# Patient Record
Sex: Female | Born: 2000 | Race: White | Hispanic: No | Marital: Single | Smoking: Never smoker
Health system: Southern US, Community
[De-identification: ages and names within clinical notes are randomized; demographics above are authoritative.]

## PROBLEM LIST (undated history)

## (undated) DIAGNOSIS — Q446 Cystic disease of liver: Secondary | ICD-10-CM

## (undated) DIAGNOSIS — K219 Gastro-esophageal reflux disease without esophagitis: Secondary | ICD-10-CM

## (undated) DIAGNOSIS — I10 Essential (primary) hypertension: Secondary | ICD-10-CM

## (undated) DIAGNOSIS — N83209 Unspecified ovarian cyst, unspecified side: Secondary | ICD-10-CM

## (undated) DIAGNOSIS — Z87442 Personal history of urinary calculi: Secondary | ICD-10-CM

## (undated) DIAGNOSIS — G930 Cerebral cysts: Secondary | ICD-10-CM

## (undated) DIAGNOSIS — Z8719 Personal history of other diseases of the digestive system: Secondary | ICD-10-CM

## (undated) DIAGNOSIS — Q613 Polycystic kidney, unspecified: Secondary | ICD-10-CM

## (undated) DIAGNOSIS — R519 Headache, unspecified: Secondary | ICD-10-CM

## (undated) DIAGNOSIS — N200 Calculus of kidney: Secondary | ICD-10-CM

## (undated) HISTORY — DX: Cerebral cysts: G93.0

## (undated) HISTORY — PX: TONSILLECTOMY: SUR1361

## (undated) HISTORY — PX: WISDOM TOOTH EXTRACTION: SHX21

## (undated) HISTORY — DX: Calculus of kidney: N20.0

## (undated) HISTORY — DX: Unspecified ovarian cyst, unspecified side: N83.209

## (undated) HISTORY — PX: ADENOIDECTOMY: SUR15

## (undated) HISTORY — DX: Essential (primary) hypertension: I10

## (undated) HISTORY — DX: Cystic disease of liver: Q44.6

## (undated) HISTORY — DX: Polycystic kidney, unspecified: Q61.3

## (undated) HISTORY — PX: TONSILLECTOMY AND ADENOIDECTOMY: SUR1326

---
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
## 2021-01-04 ENCOUNTER — Ambulatory Visit
Admission: RE | Admit: 2021-01-04 | Discharge: 2021-01-04 | Disposition: A | Discharge status: Home / Self Care | Payer: BC Managed Care – PPO | Source: Ambulatory Visit | Attending: Physician Assistant | Admitting: Physician Assistant

## 2021-01-04 DIAGNOSIS — K7689 Other specified diseases of liver: Secondary | ICD-10-CM | POA: Diagnosis not present

## 2021-01-04 DIAGNOSIS — N281 Cyst of kidney, acquired: Secondary | ICD-10-CM | POA: Diagnosis not present

## 2021-01-04 DIAGNOSIS — R1013 Epigastric pain: Secondary | ICD-10-CM

## 2021-01-06 ENCOUNTER — Other Ambulatory Visit: Payer: Self-pay

## 2021-01-06 DIAGNOSIS — Q613 Polycystic kidney, unspecified: Secondary | ICD-10-CM

## 2021-01-12 ENCOUNTER — Encounter: Payer: Self-pay | Admitting: Nurse Practitioner

## 2021-01-12 ENCOUNTER — Encounter: Payer: Self-pay | Admitting: Physician Assistant

## 2021-01-18 ENCOUNTER — Other Ambulatory Visit: Payer: Self-pay

## 2021-01-18 ENCOUNTER — Ambulatory Visit (INDEPENDENT_AMBULATORY_CARE_PROVIDER_SITE_OTHER): Payer: BC Managed Care – PPO | Admitting: Internal Medicine

## 2021-01-18 ENCOUNTER — Encounter: Payer: Self-pay | Admitting: Internal Medicine

## 2021-01-18 VITALS — BP 124/78 | HR 77 | Temp 98.0°F | Resp 12 | Ht 65.0 in | Wt 126.0 lb

## 2021-01-18 DIAGNOSIS — Q613 Polycystic kidney, unspecified: Secondary | ICD-10-CM

## 2021-01-18 DIAGNOSIS — R519 Headache, unspecified: Secondary | ICD-10-CM

## 2021-01-18 NOTE — Progress Notes (Signed)
New Patient Office Visit  Subjective:  Patient ID: Alison Ballard, female    DOB: [birth year]  Age: 20 y.o. MRN: [id number]  CC:  No chief complaint on file.   HPI Patient is an 20 year old Caucasian female with no significant past medical history.  Patient presented to the primary care provider recently with intermittent back/flank pain, recurrent abdominal pain, including epigastric pain; fullness of the flank with associated early satiety.  Patient was seen by her primary care provider, and right upper quadrant ultrasound was ordered to rule out gallbladder pathology.  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  Patient's mother has had ultrasound of the kidney that was negative for cysts.  Patient's father's work-up is still pending.  Patient denied noticing bloody or foamy urine.  Blood pressure has been controlled.  No history of previously diagnosed recurrent UTIs/pyelonephritis or nephrolithiasis.  Patient has history of persistent headaches.  No neck pain, chest pain, shortness of breath, URI symptoms, fever or chills.  Patient endorses history of nausea with epigastric discomfort and burning with urination.  UA done at the office was negative for nitrites or leukocytosis, but revealed 15 Mg per DL urinary protein and specific gravity of 1.030.    History reviewed. No pertinent past medical history.  Past Surgical History:  Procedure Laterality Date   ADENOIDECTOMY     TONSILLECTOMY      Family History  Problem Relation Age of Onset   Hypertension Mother    Healthy Mother    Hyperlipidemia Father    Healthy Father     Social History   Socioeconomic History   Marital status: Single    Spouse name: Not on file   Number of children: Not on file   Years of education: Not on file   Highest education level: Not on file  Occupational History   Not on  file  Tobacco Use   Smoking status: Never   Smokeless tobacco: Never  Vaping Use   Vaping Use: Never used  Substance and Sexual Activity   Alcohol use: Never   Drug use: Never   Sexual activity: Not Currently  Other Topics Concern   Not on file  Social History Narrative   Not on file   Social Determinants of Health   Financial Resource Strain: Not on file  Food Insecurity: Not on file  Transportation Needs: Not on file  Physical Activity: Not on file  Stress: Not on file  Social Connections: Not on file  Intimate Partner Violence: Not on file    ROS Review of Systems  Constitutional: Negative.   HENT: Negative.    Eyes: Negative.   Respiratory: Negative.    Cardiovascular: Negative.   Gastrointestinal:  Positive for nausea.  Endocrine: Negative.   Genitourinary:  Positive for dysuria and flank pain.  Allergic/Immunologic: Negative.   Neurological: Negative.   Hematological: Negative.   Psychiatric/Behavioral: Negative.     Objective:   Today's Vitals: BP 124/78   Pulse 77   Temp 98 F (36.7 C)   Resp 12   Ht 5\' 5"  (1.651 m)   Wt 126 lb (57.2 kg)   SpO2 98%   BMI 20.97 kg/m   PHYSICAL EXAM Physical Exam Constitutional:      Appearance: Normal appearance. She is normal weight.  HENT:     Head: Normocephalic and atraumatic.     Nose: Nose normal.  Mouth/Throat:     Mouth: Mucous membranes are moist.     Pharynx: Oropharynx is clear.  Eyes:     Extraocular Movements: Extraocular movements intact.     Pupils: Pupils are equal, round, and reactive to light.     Comments: Mild pallor.  Cardiovascular:     Rate and Rhythm: Normal rate and regular rhythm.     Pulses: Normal pulses.     Heart sounds: Normal heart sounds.  Pulmonary:     Effort: Pulmonary effort is normal.     Breath sounds: Normal breath sounds.  Abdominal:     General: Abdomen is flat. Bowel sounds are normal.     Palpations: Abdomen is soft.  Musculoskeletal:     Cervical  back: Normal range of motion and neck supple.     Right lower leg: No edema.     Left lower leg: No edema.  Neurological:     General: No focal deficit present.     Mental Status: She is alert and oriented to person, place, and time. Mental status is at baseline.  Psychiatric:        Mood and Affect: Mood normal.        Behavior: Behavior normal.        Thought Content: Thought content normal.        Judgment: Judgment normal.    Assessment & Plan:   Problem List Items Addressed This Visit       Genitourinary   Polycystic kidney disease   Relevant Orders   CT ABDOMEN PELVIS W WO CONTRAST   MR Brain W Wo Contrast   Other Visit Diagnoses     Nonintractable headache, unspecified chronicity pattern, unspecified headache type    -  Primary   Relevant Orders   CT ABDOMEN PELVIS W WO CONTRAST   MR Brain W Wo Contrast       Outpatient Encounter Medications as of 01/18/2021  Medication Sig   ondansetron (ZOFRAN) 4 MG tablet Take 1 tablet (4 mg total) by mouth every 8 (eight) hours as needed for nausea or vomiting.   No facility-administered encounter medications on file as of 01/18/2021.    Follow-up: Return in about 1 year (around 01/18/2022), with the goal being for the patient to follow-up with local nephrology group in [place], [place].Barnetta Chapel, MD

## 2021-01-24 ENCOUNTER — Encounter: Payer: Self-pay | Admitting: Physician Assistant

## 2021-01-24 DIAGNOSIS — Q613 Polycystic kidney, unspecified: Secondary | ICD-10-CM

## 2021-01-25 NOTE — Addendum Note (Signed)
Addended by: Sylvester Harder on: 01/25/2021 09:10 AM   Modules accepted: Orders

## 2021-01-31 ENCOUNTER — Ambulatory Visit (HOSPITAL_COMMUNITY)
Admission: RE | Admit: 2021-01-31 | Discharge: 2021-01-31 | Disposition: A | Discharge status: Home / Self Care | Payer: BC Managed Care – PPO | Source: Ambulatory Visit | Attending: Internal Medicine | Admitting: Internal Medicine

## 2021-01-31 ENCOUNTER — Other Ambulatory Visit: Payer: Self-pay

## 2021-01-31 ENCOUNTER — Encounter (HOSPITAL_COMMUNITY): Payer: Self-pay

## 2021-01-31 DIAGNOSIS — K7689 Other specified diseases of liver: Secondary | ICD-10-CM | POA: Diagnosis not present

## 2021-01-31 DIAGNOSIS — R519 Headache, unspecified: Secondary | ICD-10-CM | POA: Insufficient documentation

## 2021-01-31 DIAGNOSIS — Q613 Polycystic kidney, unspecified: Secondary | ICD-10-CM

## 2021-01-31 DIAGNOSIS — N200 Calculus of kidney: Secondary | ICD-10-CM | POA: Diagnosis not present

## 2021-01-31 MED ORDER — GADOBUTROL 1 MMOL/ML IV SOLN
6.0000 mL | Freq: Once | INTRAVENOUS | Status: AC | PRN
  Administered 2021-01-31: 6 mL via INTRAVENOUS

## 2021-01-31 MED ORDER — IOHEXOL 350 MG/ML SOLN
75.0000 mL | Freq: Once | INTRAVENOUS | Status: AC | PRN
  Administered 2021-01-31: 75 mL via INTRAVENOUS

## 2021-02-01 NOTE — Progress Notes (Signed)
02/01/2021 Alison Ballard [id number] [birth year]   CHIEF COMPLAINT: abdominal pain, bloat and constipation   HISTORY OF PRESENT ILLNESS:  Alison Ballard is an 20 who was recently diagnosed with of polycytic kidney disease. Past surgical history includes a tonsillectomy in 2008. She was referred to our office by Alison Masker PA-C for further evaluation regarding early satiety and epigastric pain.  She is accompanied by her mother.  She complains of having localized burning epigastric pain for the past 2 years.  She took Omeprazole for a few months as prescribed by her PCP approximately 1 year ago without improvement.  Her epigastric pain previously occurred twice weekly and for the past few months occurs once weekly.  No dysphagia.  She has nausea every time she eats for the past year.  She vomits partially digested food or dry heaves once monthly.  No coffee-ground emesis or hematemesis.  She takes a few bites of food and feels full easily.  She has chronic constipation since early childhood as reported by her mother.  She reports passing a moderate amount of clumps of brown stool once weekly.  No rectal bleeding or black stools.  She complains of abdominal gas bloat discomfort which occurs daily.  Her nausea and abdominal bloat symptoms are worse after she eats greasy or fried foods.  She drinks 16 ounces of water daily.  She took MiraLAX daily in the past for 1 year without significant improvement in her constipation pattern.  No history of an eating disorder.  She was seen by her PCP earlier in June due to her GI symptoms and abdominal image studies resulted in the diagnosis of her polycystic kidney disease with multiple liver cysts. 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  She  underwent a CTAP 01/31/2021 which showed multiple hepatic cysts throughout both lobes of the liver, the largest measuring 1.1 cm in the right hepatic lobe and 1.3 cm in segment 4B, no suspicious liver lesions were noted.  Pulm bilateral renal masses, too small to characterize but is most likely represent complex cysts, other low-density renal lesions favored to represent cysts and minimally complex cysts. CT findings consistent with polycystic kidney/liver disease.  Colonic stool burden suggestive of constipation was also noted.  Labs 12/16/2020 show normal renal function and LFTs were also normal.  Alison Ballard's mother underwent and negative testing for polycystic kidney disease and her father is scheduled to undergo similar testing next week.  No known family history of polycystic kidney/liver disease.  CBC Latest Ref Rng & Units 12/16/2020  WBC 3.4 - 10.8 x10E3/uL 5.4  Hemoglobin 11.1 - 15.9 g/dL 00.9  Hematocrit 38.1 - 46.6 % 35.6  Platelets 150 - 450 x10E3/uL 281     CMP Latest Ref Rng & Units 12/16/2020  Glucose 65 - 99 mg/dL 87  BUN 6 - 20 mg/dL 8  Creatinine 8.29 - 9.37 mg/dL 1.69  Sodium 678 - 938 mmol/L 139  Potassium 3.5 - 5.2 mmol/L 4.0  Chloride 96 - 106 mmol/L 101  CO2 20 - 29 mmol/L 24  Calcium 8.7 - 10.2 mg/dL 9.9  Total Protein 6.0 - 8.5 g/dL 7.3  Total Bilirubin 0.0 - 1.2 mg/dL 0.4  Alkaline Phos 42 - 106 IU/L 53  AST 0 - 40 IU/L 13  ALT 0 - 32 IU/L 6  Past Surgical History:  Procedure Laterality Date   ADENOIDECTOMY     TONSILLECTOMY     RUQ sonogram 01/04/2021: Abdominal sonogram:  Gallbladder:No gallstones or wall thickening visualized. No sonographic Murphy sign noted by sonographer.   Common bile duct:Diameter: 3.2 mm, nondilated.   Liver:Multitude of small simple appearing cysts present throughout the liver, largest measuring up to 1 point cm in size without concerning internal complexity. Normal background parenchymal echogenicity. Smooth liver surface contour.  No visible concerning solid liver lesion. No intrahepatic biliary ductal dilatation. Portal vein is patent on color Doppler imaging with normal direction of blood flow towards the liver.   Other: Numerous renal cysts are incidentally noted within the partially visualized kidney as well several of which demonstrate some peripheral echogenicity/possible calcification.   IMPRESSION: Multiple small simple appearing cyst present diffusely throughout the liver with cysts throughout the partially visualized right kidney. Such a constellation of findings can be seen with autosomal dominant polycystic kidney disease.   Otherwise unremarkable right upper quadrant ultrasound.   CTAP w/wo contrast 01/31/2021: Stomach/Bowel: Normal stomach, without wall thickening. Colonic stool burden suggests constipation. The cecum extends into the upper central pelvis. Normal terminal ileum. The appendix is positioned to the left of midline and normal Adrenals/Urinary Tract: Normal adrenal glands. Punctate bilateral renal collecting system calculi. Multiple bilateral renal masses. Some demonstrate precontrast hyperattenuation on series 2 including at maximally 9 mm on image 29. These are all too small to characterize, but most likely represent complex cysts. Other well-circumscribed low-density renal lesions are favored to represent cysts and minimally complex cyst. Example at 1.7 cm within the interpolar left kidney on 27/11. Maximally 1.9 cm within the interpolar right kidney including on 37/11. No hydronephrosis. Normal post-contrast appearance of the bladder. Hepatobiliary: Multiple hepatic cysts throughout both lobes the liver. The largest lesions including 1.1 cm in the high right hepatic lobe on 08/06 and 1.3 cm in segment 4 B on 35/6. No suspicious liver lesion. Normal gallbladder, without biliary ductal dilatation. 1. Findings most consistent with polycystic kidney/liver disease. 2. Bilateral  nephrolithiasis without obstructive uropathy. 3.  Possible constipation. 4. Left ovarian corpus luteal cyst  Social History: She is single. 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  Non-smoker.  No alcohol use.  No drug use.  Family History: Mother with hypertension. Father hyperlipidemia. Brother age 67 healthy.   No Known Allergies    Outpatient Encounter Medications as of 02/02/2021  Medication Sig   ondansetron (ZOFRAN) 4 MG tablet Take 1 tablet (4 mg total) by mouth every 8 (eight) hours as needed for nausea or vomiting.   No facility-administered encounter medications on file as of 02/02/2021.   REVIEW OF SYSTEMS:  Gen: Denies fever, sweats or chills. No weight loss.  CV: Denies chest pain, palpitations or edema. Resp: Denies cough, shortness of breath of hemoptysis.  GI: See HPI. GU : Denies urinary burning, blood in urine, increased urinary frequency or incontinence. MS: Denies joint pain, muscles aches or weakness. Derm: Denies rash, itchiness, skin lesions or unhealing ulcers. Psych: Denies depression, anxiety or memory loss. Heme: Denies bruising, bleeding. Neuro:  Denies headaches, dizziness or paresthesias. Endo:  Denies any problems with DM, thyroid or adrenal function.  PHYSICAL EXAM: BP 110/70 (BP Location: Left Arm, Patient Position: Sitting, Cuff Size: Normal)   Pulse 80   Ht 5\' 5"  (1.651 m) Comment: height measured without shoes  Wt 130 lb (59 kg)   LMP 01/04/2021 (Approximate)  BMI 21.63 kg/m   General: Pleasant 20 year old female in no acute distress. Head: Normocephalic and atraumatic. Eyes:  Sclerae non-icteric, conjunctive pink. Ears: Normal auditory acuity. Mouth: Dentition intact. No ulcers or lesions.  Neck: Supple, no lymphadenopathy or thyromegaly.  Lungs: Clear bilaterally to auscultation without wheezes, crackles or rhonchi. Heart: Regular rate and rhythm. No murmur, rub or gallop appreciated.  Abdomen: Soft,  nontender, non distended. No masses. No hepatosplenomegaly. Normoactive bowel sounds x 4 quadrants.  Rectal: Deferred. Musculoskeletal: Symmetrical with no gross deformities. Skin: Warm and dry. No rash or lesions on visible extremities. Extremities: No edema. Neurological: Alert oriented x 4, no focal deficits.  Psychological:  Alert and cooperative. Normal mood and affect.  ASSESSMENT AND PLAN:  11. 20 year old female with nausea, intermittent vomiting and epigastric pain. RUQ sono showed a normal gallbladder.  -Check stool H. pylori antigen -Pantoprazole 20 mg daily, to start after H. pylori stool antigen specimen collected -GERD diet handout -Patient to call office if symptoms worsen -Eventual EGD if symptoms persists  -Zofran as previously prescribed as needed -Follow-up with Dr. Lavon Paganini in 6 to 8 weeks  2.  Chronic constipation with associated abdominal bloat -Patient elects to restart MiraLAX once daily, will increase to twice daily if needed -If no improvement in 2 weeks I will prescribe Linzess. Goal is to increase stool output which will hopefully reduce her abdominal bloat and possibly reduce her nausea -Probiotic of choice, elective -TSH, TTG, IgA level -Increase water intake    3. Polycystic kidney disease, newly diagnosed.  She was seen at [facility] in [place] and is currently in the process of transitioning to [facility] in [place]  4. Multiple liver cysts in setting of newly diagnosed polycystic kidney disease.  Normal LFTs. - I discussed with the patient and her mother that her liver cysts are benign findings -I will consult with Dr. Lavon Paganini to verify if any further hepatology evaluation required or if surveillance liver imaging required to assess for increase in size of the liver cysts     CC:  Alison Masker, PA-C

## 2021-02-02 ENCOUNTER — Encounter: Payer: Self-pay | Admitting: Nurse Practitioner

## 2021-02-02 ENCOUNTER — Ambulatory Visit (INDEPENDENT_AMBULATORY_CARE_PROVIDER_SITE_OTHER): Payer: BC Managed Care – PPO | Admitting: Nurse Practitioner

## 2021-02-02 ENCOUNTER — Other Ambulatory Visit: Payer: Self-pay | Admitting: *Deleted

## 2021-02-02 ENCOUNTER — Other Ambulatory Visit (INDEPENDENT_AMBULATORY_CARE_PROVIDER_SITE_OTHER): Payer: BC Managed Care – PPO

## 2021-02-02 VITALS — BP 110/70 | HR 80 | Ht 65.0 in | Wt 130.0 lb

## 2021-02-02 DIAGNOSIS — R1013 Epigastric pain: Secondary | ICD-10-CM

## 2021-02-02 DIAGNOSIS — K7689 Other specified diseases of liver: Secondary | ICD-10-CM | POA: Diagnosis not present

## 2021-02-02 DIAGNOSIS — K5900 Constipation, unspecified: Secondary | ICD-10-CM

## 2021-02-02 DIAGNOSIS — R112 Nausea with vomiting, unspecified: Secondary | ICD-10-CM | POA: Diagnosis not present

## 2021-02-02 LAB — TSH: TSH: 0.72 u[IU]/mL (ref 0.40–5.00)

## 2021-02-02 MED ORDER — PANTOPRAZOLE SODIUM 20 MG PO TBEC: 20.0000 mg | DELAYED_RELEASE_TABLET | Freq: Every day | ORAL | 1 refills | Status: DC

## 2021-02-02 NOTE — Patient Instructions (Signed)
Your provider has requested that you go to the basement level for lab work before leaving today. Press "B" on the elevator. The lab is located at the first door on the left as you exit the elevator.   Due to recent changes in healthcare laws, you may see the results of your imaging and laboratory studies on MyChart before your provider has had a chance to review them.  We understand that in some cases there may be results that are confusing or concerning to you. Not all laboratory results come back in the same time frame and the provider may be waiting for multiple results in order to interpret others.  Please give [place] 48 hours in order for your provider to thoroughly review all the results before contacting the office for clarification of your results.   We have sent the following medications to your pharmacy for you to pick up at your convenience: Pantoprazole, please don't start this medicine until you complete your stool testing  We are providing you with GERD information to read and follow.  Take over the counter Miralax daily, may take twice daily if needed.  Call our office if your symptoms get worse.  Follow up with Dr Lavon Paganini in 6-8 weeks please.  I appreciate the opportunity to care for you. Alcide Evener, CRNP

## 2021-02-03 LAB — TISSUE TRANSGLUTAMINASE, IGA: (tTG) Ab, IgA: <1 U/mL

## 2021-02-03 LAB — IGA: Immunoglobulin A: 126 mg/dL (ref 47–310)

## 2021-02-08 ENCOUNTER — Encounter: Payer: Self-pay | Admitting: Physician Assistant

## 2021-02-15 ENCOUNTER — Other Ambulatory Visit: Payer: BC Managed Care – PPO

## 2021-02-15 DIAGNOSIS — R112 Nausea with vomiting, unspecified: Secondary | ICD-10-CM

## 2021-02-15 DIAGNOSIS — R1013 Epigastric pain: Secondary | ICD-10-CM

## 2021-02-15 DIAGNOSIS — K5900 Constipation, unspecified: Secondary | ICD-10-CM

## 2021-02-17 LAB — H. PYLORI ANTIGEN, STOOL: H pylori Ag, Stl: NEGATIVE

## 2021-03-02 DIAGNOSIS — Z6821 Body mass index (BMI) 21.0-21.9, adult: Secondary | ICD-10-CM | POA: Diagnosis not present

## 2021-03-02 DIAGNOSIS — Z01419 Encounter for gynecological examination (general) (routine) without abnormal findings: Secondary | ICD-10-CM | POA: Diagnosis not present

## 2021-03-07 NOTE — Progress Notes (Signed)
Reviewed and agree with documentation and assessment and plan. K. Veena Vayda Dungee , MD   

## 2021-03-15 DIAGNOSIS — N181 Chronic kidney disease, stage 1: Secondary | ICD-10-CM | POA: Diagnosis not present

## 2021-03-15 DIAGNOSIS — N200 Calculus of kidney: Secondary | ICD-10-CM | POA: Diagnosis not present

## 2021-03-15 DIAGNOSIS — Q613 Polycystic kidney, unspecified: Secondary | ICD-10-CM | POA: Diagnosis not present

## 2021-03-15 DIAGNOSIS — R030 Elevated blood-pressure reading, without diagnosis of hypertension: Secondary | ICD-10-CM | POA: Diagnosis not present

## 2021-03-17 ENCOUNTER — Other Ambulatory Visit: Payer: Self-pay | Admitting: Nephrology

## 2021-03-17 DIAGNOSIS — N181 Chronic kidney disease, stage 1: Secondary | ICD-10-CM

## 2021-03-17 DIAGNOSIS — Q613 Polycystic kidney, unspecified: Secondary | ICD-10-CM

## 2021-03-23 ENCOUNTER — Ambulatory Visit
Admission: RE | Admit: 2021-03-23 | Discharge: 2021-03-23 | Disposition: A | Discharge status: Home / Self Care | Payer: BC Managed Care – PPO | Source: Ambulatory Visit | Attending: Nephrology | Admitting: Nephrology

## 2021-03-23 DIAGNOSIS — N281 Cyst of kidney, acquired: Secondary | ICD-10-CM | POA: Diagnosis not present

## 2021-03-23 DIAGNOSIS — N181 Chronic kidney disease, stage 1: Secondary | ICD-10-CM

## 2021-03-23 DIAGNOSIS — Q613 Polycystic kidney, unspecified: Secondary | ICD-10-CM

## 2021-03-25 ENCOUNTER — Encounter: Payer: Self-pay | Admitting: Physician Assistant

## 2021-03-31 ENCOUNTER — Other Ambulatory Visit: Payer: Self-pay | Admitting: Nephrology

## 2021-03-31 DIAGNOSIS — R519 Headache, unspecified: Secondary | ICD-10-CM

## 2021-03-31 DIAGNOSIS — Q613 Polycystic kidney, unspecified: Secondary | ICD-10-CM

## 2021-04-05 ENCOUNTER — Other Ambulatory Visit: Payer: Self-pay

## 2021-04-05 ENCOUNTER — Encounter: Payer: Self-pay | Admitting: Gastroenterology

## 2021-04-05 ENCOUNTER — Ambulatory Visit (INDEPENDENT_AMBULATORY_CARE_PROVIDER_SITE_OTHER): Payer: BC Managed Care – PPO | Admitting: Gastroenterology

## 2021-04-05 ENCOUNTER — Ambulatory Visit
Admission: RE | Admit: 2021-04-05 | Discharge: 2021-04-05 | Disposition: A | Discharge status: Home / Self Care | Payer: BC Managed Care – PPO | Source: Ambulatory Visit | Attending: Nephrology | Admitting: Nephrology

## 2021-04-05 VITALS — BP 118/72 | HR 82 | Ht 65.0 in | Wt 135.6 lb

## 2021-04-05 DIAGNOSIS — R519 Headache, unspecified: Secondary | ICD-10-CM | POA: Diagnosis not present

## 2021-04-05 DIAGNOSIS — K7689 Other specified diseases of liver: Secondary | ICD-10-CM | POA: Diagnosis not present

## 2021-04-05 DIAGNOSIS — Q446 Cystic disease of liver: Secondary | ICD-10-CM

## 2021-04-05 DIAGNOSIS — K5904 Chronic idiopathic constipation: Secondary | ICD-10-CM | POA: Diagnosis not present

## 2021-04-05 DIAGNOSIS — Q613 Polycystic kidney, unspecified: Secondary | ICD-10-CM

## 2021-04-05 DIAGNOSIS — R112 Nausea with vomiting, unspecified: Secondary | ICD-10-CM | POA: Diagnosis not present

## 2021-04-05 DIAGNOSIS — K219 Gastro-esophageal reflux disease without esophagitis: Secondary | ICD-10-CM | POA: Diagnosis not present

## 2021-04-05 MED ORDER — PANTOPRAZOLE SODIUM 20 MG PO TBEC: 20.0000 mg | DELAYED_RELEASE_TABLET | Freq: Every day | ORAL | 1 refills | Status: DC

## 2021-04-05 NOTE — Progress Notes (Signed)
Alison Ballard    [id number]    Jun 26, 2001  Primary Care Physician:Abonza, Genia Plants  Referring Physician: Mayer Masker, PA-C [address] [place],  [place] [address]   Chief complaint: Constipation  HPI:  20 year old very pleasant female here for follow-up visit accompanied by her mother for chronic constipation She has history of polycystic kidney and liver disease. She is having daily bowel movement with MiraLAX  She also using pantoprazole 40 mg daily, and nausea has improved and has no GERD related symptoms Overall she is doing well.  Denies any dysphagia, odynophagia, abdominal pain, melena or rectal bleeding.  No unintentional weight loss or decreased appetite.  CT abdomen pelvis with and without contrast January 31, 2021 1. Findings most consistent with polycystic kidney/liver disease. 2. Bilateral nephrolithiasis without obstructive uropathy. 3.  Possible constipation. 4. Left ovarian corpus luteal cyst  Outpatient Encounter Medications as of 04/05/2021  Medication Sig   Norethindrone Acetate-Ethinyl Estradiol (HAILEY 1.5/30) 1.5-30 MG-MCG tablet Take 1 tablet by mouth daily.   ondansetron (ZOFRAN) 4 MG tablet Take 1 tablet (4 mg total) by mouth every 8 (eight) hours as needed for nausea or vomiting.   pantoprazole (PROTONIX) 20 MG tablet Take 1 tablet (20 mg total) by mouth daily before breakfast.   No facility-administered encounter medications on file as of 04/05/2021.    Allergies as of 04/05/2021   (No Known Allergies)    Past Medical History:  Diagnosis Date   Kidney stones    Ovarian cyst    Polycystic kidney disease    Polycystic liver disease     Past Surgical History:  Procedure Laterality Date   TONSILLECTOMY AND ADENOIDECTOMY      Family History  Problem Relation Age of Onset   Hypertension Mother    Healthy Mother    Hyperlipidemia Father    Healthy Father    Ovarian cysts Paternal Aunt     Social History    Socioeconomic History   Marital status: Single    Spouse name: Not on file   Number of children: 0   Years of education: Not on file   Highest education level: Not on file  Occupational History   Occupation: Consulting civil engineer, receptionist  Tobacco Use   Smoking status: Never   Smokeless tobacco: Never  Vaping Use   Vaping Use: Never used  Substance and Sexual Activity   Alcohol use: Never   Drug use: Never   Sexual activity: Not Currently  Other Topics Concern   Not on file  Social History Narrative   Not on file   Social Determinants of Health   Financial Resource Strain: Not on file  Food Insecurity: Not on file  Transportation Needs: Not on file  Physical Activity: Not on file  Stress: Not on file  Social Connections: Not on file  Intimate Partner Violence: Not on file      Review of systems: All other review of systems negative except as mentioned in the HPI.   Physical Exam: Vitals:   04/05/21 1405  BP: 118/72  Pulse: 82  SpO2: 97%   Body mass index is 22.57 kg/m. Gen:      No acute distress HEENT:  sclera anicteric Abd:      soft, non-tender; no palpable masses, no distension Ext:    No edema Neuro: alert and oriented x 3 Psych: normal mood and affect  Data Reviewed:  Reviewed labs, radiology imaging, old records  and pertinent past GI work up   Assessment and Plan/Recommendations:  20 year old very pleasant female with polycystic kidney and liver disease here for follow-up of chronic idiopathic constipation and GERD  Chronic idiopathic constipation: Improved with daily MiraLAX, continue with bowel regimen Continue with high-fiber diet and water intake  GERD and nausea: Symptoms have significantly improved with daily pantoprazole Continue antireflux measures We will plan to decrease pantoprazole to 20 mg daily for 4 to 6 weeks and then stop Advised patient to use Pepcid 20 mg daily as needed for breakthrough symptoms  Polycystic kidney liver  disease: She is currently completing work-up and genetic testing, she will benefit from following up at multidisciplinary clinic, will explore the options  Return in 6 months or sooner if needed  The patient was provided an opportunity to ask questions and all were answered. The patient agreed with the plan and demonstrated an understanding of the instructions.  Iona Beard , MD    CC: Mayer Masker, PA-C

## 2021-04-05 NOTE — Patient Instructions (Addendum)
Continue using Miralax as needed.   Continue Pantoprazole 20mg  once daily for 1 month, then switch to Pepcid 20mg  1 daily ( over the counter) for 1 month. After one month you may stop taking Pepcid daily and use only as needed.   Try to drink at least 8-10 cups of water daily.   We will see you back in 6 months. Office will call with an appointment at a later time to schedule.    If you are age 19 or younger, your body mass index should be between 19-25. Your Body mass index is 22.57 kg/m. If this is out of the aformentioned range listed, please consider follow up with your Primary Care Provider.   __________________________________________________________  The [facility] providers would like to encourage you to use [facility] to communicate with providers for non-urgent requests or questions.  Due to long hold times on the telephone, sending your provider a message by [facility] may be a faster and more efficient way to get a response.  Please allow 48 business hours for a response.  Please remember that this is for non-urgent requests.   Thank you for choosing me and [facility].  Dr.Nandigam

## 2021-04-06 ENCOUNTER — Encounter: Payer: Self-pay | Admitting: Gastroenterology

## 2021-05-03 DIAGNOSIS — N200 Calculus of kidney: Secondary | ICD-10-CM | POA: Diagnosis not present

## 2021-05-03 DIAGNOSIS — R030 Elevated blood-pressure reading, without diagnosis of hypertension: Secondary | ICD-10-CM | POA: Diagnosis not present

## 2021-05-03 DIAGNOSIS — N181 Chronic kidney disease, stage 1: Secondary | ICD-10-CM | POA: Diagnosis not present

## 2021-05-03 DIAGNOSIS — Q613 Polycystic kidney, unspecified: Secondary | ICD-10-CM | POA: Diagnosis not present

## 2021-05-09 ENCOUNTER — Other Ambulatory Visit: Payer: Self-pay

## 2021-05-09 ENCOUNTER — Ambulatory Visit (INDEPENDENT_AMBULATORY_CARE_PROVIDER_SITE_OTHER): Payer: BC Managed Care – PPO | Admitting: Physician Assistant

## 2021-05-09 ENCOUNTER — Encounter: Payer: Self-pay | Admitting: Physician Assistant

## 2021-05-09 VITALS — BP 106/69 | HR 64 | Temp 98.2°F | Ht 65.0 in | Wt 137.0 lb

## 2021-05-09 DIAGNOSIS — Z3041 Encounter for surveillance of contraceptive pills: Secondary | ICD-10-CM

## 2021-05-09 DIAGNOSIS — Z0000 Encounter for general adult medical examination without abnormal findings: Secondary | ICD-10-CM

## 2021-05-09 DIAGNOSIS — Z113 Encounter for screening for infections with a predominantly sexual mode of transmission: Secondary | ICD-10-CM

## 2021-05-09 DIAGNOSIS — Z118 Encounter for screening for other infectious and parasitic diseases: Secondary | ICD-10-CM

## 2021-05-09 DIAGNOSIS — Q613 Polycystic kidney, unspecified: Secondary | ICD-10-CM

## 2021-05-09 NOTE — Progress Notes (Signed)
Subjective:     Alison Ballard is a 20 y.o. female and is here for a comprehensive physical exam. The patient reports no problems.  Social History   Socioeconomic History   Marital status: Single    Spouse name: Not on file   Number of children: 0   Years of education: Not on file   Highest education level: Not on file  Occupational History   Occupation: Consulting civil engineer, receptionist  Tobacco Use   Smoking status: Never   Smokeless tobacco: Never  Vaping Use   Vaping Use: Never used  Substance and Sexual Activity   Alcohol use: Never   Drug use: Never   Sexual activity: Not Currently  Other Topics Concern   Not on file  Social History Narrative   Not on file   Social Determinants of Health   Financial Resource Strain: Not on file  Food Insecurity: Not on file  Transportation Needs: Not on file  Physical Activity: Not on file  Stress: Not on file  Social Connections: Not on file  Intimate Partner Violence: Not on file   Health Maintenance  Topic Date Due   Pneumococcal Vaccine 66-53 Years old (1 - PPSV23 if available, else PCV20) 06/11/2004   HPV VACCINES (1 - 2-dose series) Never done   HIV Screening  Never done   Hepatitis C Screening  Never done   INFLUENZA VACCINE  02/07/2021   COVID-19 Vaccine (2 - Pfizer series) 05/09/2021   TETANUS/TDAP  05/07/2030    The following portions of the patient's history were reviewed and updated as appropriate: allergies, current medications, past family history, past medical history, past social history, past surgical history, and problem list.  Review of Systems Pertinent items noted in HPI and remainder of comprehensive ROS otherwise negative.   Objective:    BP 106/69   Pulse 64   Temp 98.2 F (36.8 C)   Ht 5\' 5"  (1.651 m)   Wt 137 lb (62.1 kg)   SpO2 100%   BMI 22.80 kg/m  General appearance: alert, cooperative, and no distress Head: Normocephalic, without obvious abnormality, atraumatic Eyes: conjunctivae/corneas  clear. PERRL, EOM's intact. Fundi benign. Ears: normal TM's and external ear canals both ears Nose: Nares normal. Septum midline. Mucosa normal. No drainage or sinus tenderness. Throat: lips, mucosa, and tongue normal; teeth and gums normal Neck: no adenopathy, supple, symmetrical, trachea midline, and thyroid: normal to inspection and palpation Back: symmetric, no curvature. ROM normal. No CVA tenderness. Lungs: clear to auscultation bilaterally Heart: regular rate and rhythm and S1, S2 normal Abdomen: soft, non-tender; bowel sounds normal; no masses,  no organomegaly Extremities: extremities normal, atraumatic, no cyanosis or edema Pulses: 2+ and symmetric Skin: Skin color, texture, turgor normal. No rashes or lesions Lymph nodes: Cervical adenopathy: normal and Supraclavicular adenopathy: normal Neurologic: Grossly normal    Assessment:    Healthy female exam.     Plan:  -Followed by OB/GYN for oral contraceptive management. Patient agreeable to GC/chlamydia screening. Pt deferred immunizations.  -Continue with adequate hydration. -Continue to avoid tobacco/etoh use. -Follow a heart healthy diet. -Continue to follow up with various specialists including nephrology and gastroenterology. -Follow up in 1 year for CPE.   See After Visit Summary for Counseling Recommendations

## 2021-05-09 NOTE — Patient Instructions (Signed)
Preventive Care 21-19 Years Old, Female Preventive care refers to lifestyle choices and visits with your health care provider that can promote health and wellness. This includes: A yearly physical exam. This is also called an annual wellness visit. Regular dental and eye exams. Immunizations. Screening for certain conditions. Healthy lifestyle choices, such as: Eating a healthy diet. Getting regular exercise. Not using drugs or products that contain nicotine and tobacco. Limiting alcohol use. What can I expect for my preventive care visit? Physical exam Your health care provider may check your: Height and weight. These may be used to calculate your BMI (body mass index). BMI is a measurement that tells if you are at a healthy weight. Heart rate and blood pressure. Body temperature. Skin for abnormal spots. Counseling Your health care provider may ask you questions about your: Past medical problems. Family's medical history. Alcohol, tobacco, and drug use. Emotional well-being. Home life and relationship well-being. Sexual activity. Diet, exercise, and sleep habits. Work and work environment. Access to firearms. Method of birth control. Menstrual cycle. Pregnancy history. What immunizations do I need? Vaccines are usually given at various ages, according to a schedule. Your health care provider will recommend vaccines for you based on your age, medical history, and lifestyle or other factors, such as travel or where you work. What tests do I need? Blood tests Lipid and cholesterol levels. These may be checked every 5 years starting at age 20. Hepatitis C test. Hepatitis B test. Screening Diabetes screening. This is done by checking your blood sugar (glucose) after you have not eaten for a while (fasting). STD (sexually transmitted disease) testing, if you are at risk. BRCA-related cancer screening. This may be done if you have a family history of breast, ovarian, tubal, or  peritoneal cancers. Pelvic exam and Pap test. This may be done every 3 years starting at age 21. Starting at age 30, this may be done every 5 years if you have a Pap test in combination with an HPV test. Talk with your health care provider about your test results, treatment options, and if necessary, the need for more tests. Follow these instructions at home: Eating and drinking  Eat a healthy diet that includes fresh fruits and vegetables, whole grains, lean protein, and low-fat dairy products. Take vitamin and mineral supplements as recommended by your health care provider. Do not drink alcohol if: Your health care provider tells you not to drink. You are pregnant, may be pregnant, or are planning to become pregnant. If you drink alcohol: Limit how much you have to 0-1 drink a day. Be aware of how much alcohol is in your drink. In the [place] one drink equals one 12 oz bottle of beer (355 mL), one 5 oz glass of wine (148 mL), or one 1 oz glass of hard liquor (44 mL). Lifestyle Take daily care of your teeth and gums. Brush your teeth every morning and night with fluoride toothpaste. Floss one time each day. Stay active. Exercise for at least 30 minutes 5 or more days each week. Do not use any products that contain nicotine or tobacco, such as cigarettes, e-cigarettes, and chewing tobacco. If you need help quitting, ask your health care provider. Do not use drugs. If you are sexually active, practice safe sex. Use a condom or other form of protection to prevent STIs (sexually transmitted infections). If you do not wish to become pregnant, use a form of birth control. If you plan to become pregnant, see your health care provider   for a prepregnancy visit. Find healthy ways to cope with stress, such as: Meditation, yoga, or listening to music. Journaling. Talking to a trusted person. Spending time with friends and family. Safety Always wear your seat belt while driving or riding in a  vehicle. Do not drive: If you have been drinking alcohol. Do not ride with someone who has been drinking. When you are tired or distracted. While texting. Wear a helmet and other protective equipment during sports activities. If you have firearms in your house, make sure you follow all gun safety procedures. Seek help if you have been physically or sexually abused. What's next? Go to your health care provider once a year for an annual wellness visit. Ask your health care provider how often you should have your eyes and teeth checked. Stay up to date on all vaccines. This information is not intended to replace advice given to you by your health care provider. Make sure you discuss any questions you have with your health care provider. Document Revised: 09/03/2020 Document Reviewed: 03/07/2018 Elsevier Patient Education  2022 [employer]  

## 2021-05-10 LAB — GC/CHLAMYDIA PROBE AMP
Chlamydia trachomatis, NAA: NEGATIVE
Neisseria Gonorrhoeae by PCR: NEGATIVE

## 2021-05-21 ENCOUNTER — Other Ambulatory Visit: Payer: Self-pay | Admitting: Physician Assistant

## 2021-05-21 DIAGNOSIS — R110 Nausea: Secondary | ICD-10-CM

## 2021-05-30 ENCOUNTER — Encounter: Payer: Self-pay | Admitting: Physician Assistant

## 2021-06-23 ENCOUNTER — Ambulatory Visit (INDEPENDENT_AMBULATORY_CARE_PROVIDER_SITE_OTHER): Payer: BC Managed Care – PPO | Admitting: Physician Assistant

## 2021-06-23 ENCOUNTER — Other Ambulatory Visit: Payer: Self-pay

## 2021-06-23 ENCOUNTER — Encounter: Payer: Self-pay | Admitting: Physician Assistant

## 2021-06-23 VITALS — BP 127/85 | HR 72 | Temp 97.4°F | Ht 65.0 in | Wt 139.0 lb

## 2021-06-23 DIAGNOSIS — H1031 Unspecified acute conjunctivitis, right eye: Secondary | ICD-10-CM | POA: Diagnosis not present

## 2021-06-23 MED ORDER — AZITHROMYCIN 250 MG PO TABS: ORAL_TABLET | ORAL | 0 refills | Status: AC

## 2021-06-23 NOTE — Progress Notes (Signed)
° °Acute Office Visit ° °Subjective:  ° ° Patient ID: Alison Ballard, female    DOB: 06/01/2001, 20 y.o.   MRN: [id number] ° °Chief Complaint  °Patient presents with  ° Acute Visit  ° ° °HPI °Patient is in today for concerns of allergic reaction and possible pink eye. This morning woke up with right eye matted with green drainage.  Has a prior history of pinkeye.  No sore throat, dysphagia, abdominal pain, fever, cough, n/v/d or recent sickness.  States also has a pimple with central pustule at upper eyelid.  Also reports Saturday morning woke up with her lips swollen and had noticed 4 small blisters on her inner lip which burst with bloody drainage.  Patient denies ingesting new foods, using new soaps or detergents.  States has started a new facial oil.  Denies rash, tongue swelling or tingling sensation, or shortness of breath. ° ° °Past Medical History:  °Diagnosis Date  ° Kidney stones   ° Ovarian cyst   ° Polycystic kidney disease   ° Polycystic liver disease   ° ° °Past Surgical History:  °Procedure Laterality Date  ° TONSILLECTOMY AND ADENOIDECTOMY    ° ° °Family History  °Problem Relation Age of Onset  ° Hypertension Mother   ° Healthy Mother   ° Hyperlipidemia Father   ° Healthy Father   ° Ovarian cysts Paternal Aunt   ° ° °Social History  ° °Socioeconomic History  ° Marital status: Single  °  Spouse name: Not on file  ° Number of children: 0  ° Years of education: Not on file  ° Highest education level: Not on file  °Occupational History  ° Occupation: student, receptionist  °Tobacco Use  ° Smoking status: Never  ° Smokeless tobacco: Never  °Vaping Use  ° Vaping Use: Never used  °Substance and Sexual Activity  ° Alcohol use: Never  ° Drug use: Never  ° Sexual activity: Not Currently  °Other Topics Concern  ° Not on file  °Social History Narrative  ° Not on file  ° °Social Determinants of Health  ° °Financial Resource Strain: Not on file  °Food Insecurity: Not on file  °Transportation Needs: Not on file   °Physical Activity: Not on file  °Stress: Not on file  °Social Connections: Not on file  °Intimate Partner Violence: Not on file  ° ° °Outpatient Medications Prior to Visit  °Medication Sig Dispense Refill  ° Norethindrone Acetate-Ethinyl Estradiol (LOESTRIN) 1.5-30 MG-MCG tablet Take 1 tablet by mouth daily.    ° ondansetron (ZOFRAN) 4 MG tablet TAKE 1 TABLET BY MOUTH EVERY 8 HOURS AS NEEDED FOR NAUSEA AND VOMITING 20 tablet 0  ° pantoprazole (PROTONIX) 20 MG tablet Take 1 tablet (20 mg total) by mouth daily before breakfast. 30 tablet 1  ° °No facility-administered medications prior to visit.  ° ° °No Known Allergies ° °Review of Systems °Review of Systems:  °A fourteen system review of systems was performed and found to be positive as per HPI. °   °Objective:  °  °Physical Exam °General:  Pleasant and cooperative in no acute distress  °Neuro:  Alert and oriented,  extra-ocular muscles intact  °HEENT:  Normocephalic, atraumatic, no sinus tenderness, erythematous conjunctiva, swelling of both upper and lower eyelid of right eye, small pustule of upper right eyelid, PERRL, normal TMs of both ears, 4 small erythematous macules of inner lip, no oral ulcers noted, neck supple, no adenopathy °Skin:  no gross rash, warm, pink. °Cardiac:    RRR °Respiratory:  ECTA B/L  °Vascular:  Ext warm, no cyanosis apprec.; cap RF less 2 sec. °Psych:  No HI/SI, judgement and insight good, Euthymic mood. Full Affect. ° °BP 127/85    Pulse 72    Temp (!) 97.4 °F (36.3 °C)    Ht 5' 5" (1.651 m)    Wt 139 lb (63 kg)    SpO2 100%    BMI 23.13 kg/m²  °Wt Readings from Last 3 Encounters:  °06/23/21 139 lb (63 kg) (69 %, Z= 0.51)*  °05/09/21 137 lb (62.1 kg) (67 %, Z= 0.45)*  °04/05/21 135 lb 9.6 oz (61.5 kg) (66 %, Z= 0.40)*  ° °* Growth percentiles are based on CDC (Girls, 2-20 Years) data.  ° ° °Health Maintenance Due  °Topic Date Due  ° Pneumococcal Vaccine 19-64 Years old (1 - PPSV23 if available, else PCV20) 06/11/2004  ° HPV VACCINES (1  - 2-dose series) Never done  ° HIV Screening  Never done  ° Hepatitis C Screening  Never done  ° INFLUENZA VACCINE  02/07/2021  ° COVID-19 Vaccine (2 - Pfizer series) 05/09/2021  ° ° °   °Topic Date Due  ° HPV VACCINES (1 - 2-dose series) Never done  ° ° ° °Lab Results  °Component Value Date  ° TSH 0.72 02/02/2021  ° °Lab Results  °Component Value Date  ° WBC 5.4 12/16/2020  ° HGB 12.4 12/16/2020  ° HCT 35.6 12/16/2020  ° MCV 88 12/16/2020  ° PLT 281 12/16/2020  ° °Lab Results  °Component Value Date  ° NA 139 12/16/2020  ° K 4.0 12/16/2020  ° CO2 24 12/16/2020  ° GLUCOSE 87 12/16/2020  ° BUN 8 12/16/2020  ° CREATININE 0.70 12/16/2020  ° BILITOT 0.4 12/16/2020  ° ALKPHOS 53 12/16/2020  ° AST 13 12/16/2020  ° ALT 6 12/16/2020  ° PROT 7.3 12/16/2020  ° ALBUMIN 4.8 12/16/2020  ° CALCIUM 9.9 12/16/2020  ° EGFR 128 12/16/2020  ° °No results found for: CHOL °No results found for: HDL °No results found for: LDLCALC °No results found for: TRIG °No results found for: CHOLHDL °No results found for: HGBA1C ° °   °Assessment & Plan:  ° °Problem List Items Addressed This Visit   °None °Visit Diagnoses   ° ° Acute bacterial conjunctivitis of right eye    -  Primary  ° Relevant Medications  ° azithromycin (ZITHROMAX) 250 MG tablet  ° °  ° °Patient has s/sx suggestive of bacterial conjunctivitis so will start antibiotic therapy. Reassurance provided no signs of style or hordeolum. Mouth blisters and swollen lips have resolved and s/sx are less consistent with allergic reaction, if lip swelling or blisters re-occur then recommend further evaluation. Discussed no longer contagious after 48 hours of starting antibiotic. Patient verbalized understanding. ° ° °Meds ordered this encounter  °Medications  ° azithromycin (ZITHROMAX) 250 MG tablet  °  Sig: Take 2 tablets on day 1, then 1 tablet daily on days 2 through 5  °  Dispense:  6 tablet  °  Refill:  0  °  Order Specific Question:   Supervising Provider  °  Answer:   METHENEY, CATHERINE  D [[id number]]  ° ° ° °Maritza Abonza, PA-C ° °

## 2021-07-13 ENCOUNTER — Encounter: Payer: Self-pay | Admitting: Physician Assistant

## 2021-07-13 DIAGNOSIS — M549 Dorsalgia, unspecified: Secondary | ICD-10-CM

## 2021-07-13 MED ORDER — METHOCARBAMOL 500 MG PO TABS: 500.0000 mg | ORAL_TABLET | Freq: Two times a day (BID) | ORAL | 0 refills | Status: DC | PRN

## 2021-08-22 ENCOUNTER — Ambulatory Visit (INDEPENDENT_AMBULATORY_CARE_PROVIDER_SITE_OTHER): Payer: BC Managed Care – PPO | Admitting: Nurse Practitioner

## 2021-08-22 VITALS — BP 110/74 | HR 92 | Temp 97.5°F | Ht 65.0 in | Wt 135.0 lb

## 2021-08-22 DIAGNOSIS — Z111 Encounter for screening for respiratory tuberculosis: Secondary | ICD-10-CM

## 2021-08-22 DIAGNOSIS — A159 Respiratory tuberculosis unspecified: Secondary | ICD-10-CM

## 2021-08-22 NOTE — Progress Notes (Signed)
Patient in office today for PPD placement. Pt denies any illness. Pt denies any previous reaction to PPD test. Patient tolerated injection well. Advised to follow up in 48-72 hrs for PPD read. AS, CMA

## 2021-08-22 NOTE — Addendum Note (Signed)
Addended by: Sylvester Harder on: 08/22/2021 02:26 PM   Modules accepted: Level of Service

## 2021-08-25 LAB — TB SKIN TEST
Induration: 0 mm
TB Skin Test: NEGATIVE

## 2021-09-01 ENCOUNTER — Ambulatory Visit (INDEPENDENT_AMBULATORY_CARE_PROVIDER_SITE_OTHER): Payer: BC Managed Care – PPO | Admitting: Neurology

## 2021-09-01 ENCOUNTER — Encounter: Payer: Self-pay | Admitting: Neurology

## 2021-09-01 VITALS — BP 136/89 | HR 74 | Ht 65.0 in | Wt 133.0 lb

## 2021-09-01 DIAGNOSIS — G43709 Chronic migraine without aura, not intractable, without status migrainosus: Secondary | ICD-10-CM | POA: Diagnosis not present

## 2021-09-01 DIAGNOSIS — G43009 Migraine without aura, not intractable, without status migrainosus: Secondary | ICD-10-CM | POA: Insufficient documentation

## 2021-09-01 MED ORDER — RIZATRIPTAN BENZOATE 10 MG PO TBDP: 10.0000 mg | ORAL_TABLET | ORAL | 11 refills | Status: DC | PRN

## 2021-09-01 MED ORDER — PROPRANOLOL HCL 10 MG PO TABS: ORAL_TABLET | ORAL | 1 refills | Status: DC

## 2021-09-01 NOTE — Progress Notes (Signed)
[facility]    Provider:  Dr Lucia Gaskins Requesting Provider: Estanislado Emms, MD Primary Care Provider:  Mayer Masker, PA-C  CC:  Migraines  HPI:  Alison Ballard is a 21 y.o. female here as requested by Estanislado Emms, MD for migraines.  Patient has a past medical history of polycystic kidney disease, headache, blood pressure elevation, chronic kidney disease, bilateral nephrolithiasis,, hypertension, chronic kidney disease, she was referred by Vallery Sa at [facility], they ordered a renal ultrasound and MRI of the head, she has seen gastroenterology and they have help with constipation, she is working on fluid intake coat, overall pain is better sometimes doing activity not an issue since early September on a trip.  She was recently diagnosed with PKD and the pain they are referring to his flank pain and fullness of the flank and early satiety.  They have also ordered CT of the abdomen and MRI of the brain with and without contrast.  She states she has headaches.  Imaging could not rule out aneurysm per notes from [facility].  She has had headaches since the age of 70. Starts in the occipital area and stiffness and then radiates to the head, pulsating/pounding/throbbing, light and sound sensitivity, moderate to severe in pain, nausea and vomiting, can last 24 hours. They have been getting worse over the last few years, took too much ibuprofen because it seemed to helps and hurt her stomach, on a muscle relaxant helps a little, she has at least 15 headache days a month, 8 migrainous mod to severe ongoing for at least 6  months, no aura. Unknown triggers, a lot of times wakes up with them but no snoring, no excessive daytime fatigue, no numbness or tingling.  A lot of neck tightness. Has been just lving with it. No aura. No other focal neurologic deficits, associated symptoms, inciting events or modifiable factors.  Reviewed notes, labs and imaging  from outside physicians, which showed: personally reviewed images and agree(additional 15 minutes in addition):   September 2022 MRA of the head normal no aneurysm or other vascular abnormality identified  January 31, 2021 MRI with and without contrast showed no evidence of acute intracranial abnormality, small nonenhancing discrete T2 hyperintensities in the right periatrial and left parietal white matter suppressed with FLAIR imaging and may represent benign dilated perivascular spaces, benign cysts, or less likely the sequelae of prior insult.  He does note an aneurysm and is not excluded on this study. (I reviewed images with patient and mother as well, feel findings above are benign)  01/31/2021: CT abdomen pelvis with and without contrast findings most consistent with polycystic kidney liver disease, bilateral nephrolithiasis without obstructive uropathy, constipation, left ovarian corpus luteal cyst.   Review of Systems: Patient complains of symptoms per HPI as well as the following symptoms headaches. Pertinent negatives and positives per HPI. All others negative.   Social History   Socioeconomic History   Marital status: Single    Spouse name: Not on file   Number of children: 0   Years of education: Not on file   Highest education level: Not on file  Occupational History   Occupation: Consulting civil engineer, receptionist  Tobacco Use   Smoking status: Never   Smokeless tobacco: Never  Vaping Use   Vaping Use: Never used  Substance and Sexual Activity   Alcohol use: Never   Drug use: Never   Sexual activity: Not Currently  Other Topics Concern   Not on file  Social History Narrative   Caffeine 1 dink daily.  Working no, [place] [employer] [place]   Social Determinants of Health   Financial Resource Strain: Not on file  Food Insecurity: Not on file  Transportation Needs: Not on file  Physical Activity: Not on file  Stress: Not on file  Social Connections: Not on file  Intimate  Partner Violence: Not on file    Family History  Problem Relation Age of Onset   Hypertension Mother    Healthy Mother    Hyperlipidemia Father    Healthy Father    Ovarian cysts Paternal Aunt    Migraines Neg Hx     Past Medical History:  Diagnosis Date   Brain cyst    Kidney stones    Ovarian cyst    Polycystic kidney disease    Polycystic liver disease     Patient Active Problem List   Diagnosis Date Noted   Chronic migraine without aura without status migrainosus, not intractable 09/01/2021   Constipation 02/02/2021   Nausea with vomiting 02/02/2021   Polycystic kidney disease 01/18/2021    Past Surgical History:  Procedure Laterality Date   TONSILLECTOMY AND ADENOIDECTOMY      Current Outpatient Medications  Medication Sig Dispense Refill   [name] tablet Take 1 tablet by mouth daily.     methocarbamol (ROBAXIN) 500 MG tablet Take 1 tablet (500 mg total) by mouth 2 (two) times daily as needed for muscle spasms. 30 tablet 0   ondansetron (ZOFRAN) 4 MG tablet TAKE 1 TABLET BY MOUTH EVERY 8 HOURS AS NEEDED FOR NAUSEA AND VOMITING 20 tablet 0   propranolol (INDERAL) 10 MG tablet Start with one tab twice daily for 1 week. Then increase to 2 tabs(20mg ) twice daily. Stay on this for a few weeks and if no side effects please contact me. 120 tablet 1   rizatriptan (MAXALT-MLT) 10 MG disintegrating tablet Take 1 tablet (10 mg total) by mouth as needed for migraine. May repeat in 2 hours if needed 9 tablet 11   No current facility-administered medications for this visit.    Allergies as of 09/01/2021   (No Known Allergies)    Vitals: BP 136/89    Pulse 74    Ht 5\' 5"  (1.651 m)    Wt 133 lb (60.3 kg)    BMI 22.13 kg/m  Last Weight:  Wt Readings from Last 1 Encounters:  09/01/21 133 lb (60.3 kg) (60 %, Z= 0.25)*   * Growth percentiles are based on CDC (Girls, 2-20 Years) data.   Last Height:   Ht Readings from Last 1 Encounters:  09/01/21 5'  5" (1.651 m) (61 %, Z= 0.28)*   * Growth percentiles are based on CDC (Girls, 2-20 Years) data.     Physical exam: Exam: Gen: NAD, conversant, well nourised, obese, well groomed                     CV: RRR, no MRG. No Carotid Bruits. No peripheral edema, warm, nontender Eyes: Conjunctivae clear without exudates or hemorrhage  Neuro: Detailed Neurologic Exam  Speech:    Speech is normal; fluent and spontaneous with normal comprehension.  Cognition:    The patient is oriented to person, place, and time;     recent and remote memory intact;     language fluent;     normal attention, concentration,     fund of knowledge Cranial Nerves:    The pupils  are equal, round, and reactive to light. The fundi are flat. Visual fields are full to finger confrontation. Extraocular movements are intact. Trigeminal sensation is intact and the muscles of mastication are normal. The face is symmetric. The palate elevates in the midline. Hearing intact. Voice is normal. Shoulder shrug is normal. The tongue has normal motion without fasciculations.   Coordination:    Normal finger to nose and heel to shin. Normal rapid alternating movements.   Gait:    Heel-toe and tandem gait are normal.   Motor Observation:    No asymmetry, no atrophy, and no involuntary movements noted. Tone:    Normal muscle tone.    Posture:    Posture is normal. normal erect    Strength:    Strength is V/V in the upper and lower limbs.      Sensation: intact to LT     Reflex Exam:  DTR's:    Deep tendon reflexes in the upper and lower extremities are normal bilaterally.   Toes:    The toes are downgoing bilaterally.   Clonus:    Clonus is absent.    Assessment/Plan:  Patient with chronic migraines, we spent quite a long time talking about migraines, migraine management, lifestyle factors, triggers, she is new to migraine management.  We talked about triptans, other acute and preventative medications, the new  CGRP and other classes of medications.  Mother accompanies and also provides information.  Start propranolol preventative Rizatriptan(maxalt) as needed: Please take one tablet at the onset of your headache. If it does not improve the symptoms please take one additional tablet. Do not take more then 2 tablets in 24hrs. Do not take use more then 2 to 3 times in a week. Taking it with ondansetron.  Discussed: To prevent or relieve headaches, try the following: Cool Compress. Lie down and place a cool compress on your head.  Avoid headache triggers. If certain foods or odors seem to have triggered your migraines in the past, avoid them. A headache diary might help you identify triggers.  Include physical activity in your daily routine. Try a daily walk or other moderate aerobic exercise.  Manage stress. Find healthy ways to cope with the stressors, such as delegating tasks on your to-do list.  Practice relaxation techniques. Try deep breathing, yoga, massage and visualization.  Eat regularly. Eating regularly scheduled meals and maintaining a healthy diet might help prevent headaches. Also, drink plenty of fluids.  Follow a regular sleep schedule. Sleep deprivation might contribute to headaches Consider biofeedback. With this mind-body technique, you learn to control certain bodily functions -- such as muscle tension, heart rate and blood pressure -- to prevent headaches or reduce headache pain.    Proceed to emergency room if you experience new or worsening symptoms or symptoms do not resolve, if you have new neurologic symptoms or if headache is severe, or for any concerning symptom.   Provided education and documentation from American headache Society toolbox including articles on: chronic migraine medication overuse headache, chronic migraines, prevention of migraines, behavioral and other nonpharmacologic treatments for headache.   Meds ordered this encounter  Medications   rizatriptan  (MAXALT-MLT) 10 MG disintegrating tablet    Sig: Take 1 tablet (10 mg total) by mouth as needed for migraine. May repeat in 2 hours if needed    Dispense:  9 tablet    Refill:  11   propranolol (INDERAL) 10 MG tablet    Sig: Start with one tab twice daily for  1 week. Then increase to 2 tabs(20mg ) twice daily. Stay on this for a few weeks and if no side effects please contact me.    Dispense:  120 tablet    Refill:  1    Cc: Estanislado Emms, MD,  Mayer Masker, PA-C  Naomie Dean, MD  [facility] [address] [place], [place] [address]  Phone [phone] Fax [phone]  I spent over 60 minutes of face-to-face and non-face-to-face time with patient on the  1. Chronic migraine without aura without status migrainosus, not intractable    diagnosis.  This included previsit chart review, lab review, study review, order entry, electronic health record documentation, patient education on the different diagnostic and therapeutic options, counseling and coordination of care, risks and benefits of management, compliance, or risk factor reduction

## 2021-09-01 NOTE — Patient Instructions (Signed)
Start propranolol preventative Rizatriptan(maxalt) as needed: Please take one tablet at the onset of your headache. If it does not improve the symptoms please take one additional tablet. Do not take more then 2 tablets in 24hrs. Do not take use more then 2 to 3 times in a week. Taking it with ondansetron.  Rizatriptan Disintegrating Tablets What is this medication? RIZATRIPTAN (rye za TRIP tan) treats migraines. It works by blocking pain signals and narrowing blood vessels in the brain. It belongs to a group of medications called triptans. It is not used to prevent migraines. This medicine may be used for other purposes; ask your health care provider or pharmacist if you have questions. COMMON BRAND NAME(S): Maxalt-MLT What should I tell my care team before I take this medication? They need to know if you have any of these conditions: Cigarette smoker Circulation problems in fingers and toes Diabetes Heart disease High blood pressure High cholesterol History of irregular heartbeat History of stroke Kidney disease Liver disease Stomach or intestine problems An unusual or allergic reaction to rizatriptan, other medications, foods, dyes, or preservatives Pregnant or trying to get pregnant Breast-feeding How should I use this medication? Take this medication by mouth. Follow the directions on the prescription label. Leave the tablet in the sealed blister pack until you are ready to take it. With dry hands, open the blister and gently remove the tablet. If the tablet breaks or crumbles, throw it away and take a new tablet out of the blister pack. Place the tablet in the mouth and allow it to dissolve, and then swallow. Do not cut, crush, or chew this medication. You do not need water to take this medication. Do not take it more often than directed. Talk to your care team regarding the use of this medication in children. While this medication may be prescribed for children as young as 6 years for  selected conditions, precautions do apply. Overdosage: If you think you have taken too much of this medicine contact a poison control center or emergency room at once. NOTE: This medicine is only for you. Do not share this medicine with others. What if I miss a dose? This does not apply. This medication is not for regular use. What may interact with this medication? Do not take this medication with any of the following medications: Certain medications for migraine headache like almotriptan, eletriptan, frovatriptan, naratriptan, rizatriptan, sumatriptan, zolmitriptan Ergot alkaloids like dihydroergotamine, ergonovine, ergotamine, methylergonovine MAOIs like Carbex, Eldepryl, Marplan, Nardil, and Parnate This medication may also interact with the following medications: Certain medications for depression, anxiety, or psychotic disorders Propranolol This list may not describe all possible interactions. Give your health care provider a list of all the medicines, herbs, non-prescription drugs, or dietary supplements you use. Also tell them if you smoke, drink alcohol, or use illegal drugs. Some items may interact with your medicine. What should I watch for while using this medication? Visit your care team for regular checks on your progress. Tell your care team if your symptoms do not start to get better or if they get worse. You may get drowsy or dizzy. Do not drive, use machinery, or do anything that needs mental alertness until you know how this medication affects you. Do not stand up or sit up quickly, especially if you are an older patient. This reduces the risk of dizzy or fainting spells. Alcohol may interfere with the effect of this medication. Your mouth may get dry. Chewing sugarless gum or sucking hard candy  and drinking plenty of water may help. Contact your care team if the problem does not go away or is severe. If you take migraine medications for 10 or more days a month, your migraines  may get worse. Keep a diary of headache days and medication use. Contact your care team if your migraine attacks occur more frequently. What side effects may I notice from receiving this medication? Side effects that you should report to your care team as soon as possible: Allergic reactions--skin rash, itching, hives, swelling of the face, lips, tongue, or throat Burning, pain, tingling, or color changes in the legs or feet Heart attack--pain or tightness in the chest, shoulders, arms, or jaw, nausea, shortness of breath, cold or clammy skin, feeling faint or lightheaded Heart rhythm changes--fast or irregular heartbeat, dizziness, feeling faint or lightheaded, chest pain, trouble breathing Increase in blood pressure Irritability, confusion, fast or irregular heartbeat, muscle stiffness, twitching muscles, sweating, high fever, seizure, chills, vomiting, diarrhea, which may be signs of serotonin syndrome Raynaud's--cool, numb, or painful fingers or toes that may change color from pale, to blue, to red Seizures Stroke--sudden numbness or weakness of the face, arm, or leg, trouble speaking, confusion, trouble walking, loss of balance or coordination, dizziness, severe headache, change in vision Sudden or severe stomach pain, nausea, vomiting, fever, or bloody diarrhea Vision loss Side effects that usually do not require medical attention (report to your care team if they continue or are bothersome): Dizziness General discomfort or fatigue This list may not describe all possible side effects. Call your doctor for medical advice about side effects. You may report side effects to FDA at 1-800-FDA-1088. Where should I keep my medication? Keep out of the reach of children and pets. Store at room temperature between 15 and 30 degrees C (59 and 86 degrees F). Protect from light and moisture. Throw away any unused medication after the expiration date. NOTE: This sheet is a summary. It may not cover all  possible information. If you have questions about this medicine, talk to your doctor, pharmacist, or health care provider.  2022 Elsevier/Gold Standard (2020-08-04 00:00:00) Propranolol Tablets What is this medication? PROPRANOLOL (proe PRAN oh lole) treats many conditions such as high blood pressure, tremors, and a type of arrhythmia known as AFib (atrial fibrillation). It works by lowering your blood pressure and heart rate, making it easier for your heart to pump blood to the rest of your body. It may be used to prevent migraine headaches. It works by relaxing the blood vessels in the brain that cause migraines. It belongs to a group of medications called beta blockers. This medicine may be used for other purposes; ask your health care provider or pharmacist if you have questions. COMMON BRAND NAME(S): Inderal What should I tell my care team before I take this medication? They need to know if you have any of these conditions: Circulation problems or blood vessel disease Diabetes History of heart attack or heart disease, vasospastic angina Kidney disease Liver disease Lung or breathing disease, like asthma or emphysema Pheochromocytoma Slow heart rate Thyroid disease An unusual or allergic reaction to propranolol, other beta-blockers, medications, foods, dyes, or preservatives Pregnant or trying to get pregnant Breast-feeding How should I use this medication? Take this medication by mouth. Take it as directed on the prescription label at the same time every day. Keep taking it unless your care team tells you to stop. Talk to your care team about the use of this medication in children. Special care  may be needed. Overdosage: If you think you have taken too much of this medicine contact a poison control center or emergency room at once. NOTE: This medicine is only for you. Do not share this medicine with others. What if I miss a dose? If you miss a dose, take it as soon as you can. If it  is almost time for your next dose, take only that dose. Do not take double or extra doses. What may interact with this medication? Do not take this medication with any of the following: Feverfew Phenothiazines like chlorpromazine, mesoridazine, prochlorperazine, thioridazine This medication may also interact with the following: Aluminum hydroxide gel Antipyrine Antiviral medications for HIV or AIDS Barbiturates like phenobarbital Certain medications for blood pressure, heart disease, irregular heart beat Cimetidine Ciprofloxacin Diazepam Fluconazole Haloperidol Isoniazid Medications for cholesterol like cholestyramine or colestipol Medications for mental depression Medications for migraine headache like almotriptan, eletriptan, frovatriptan, naratriptan, rizatriptan, sumatriptan, zolmitriptan NSAIDs, medications for pain and inflammation, like ibuprofen or naproxen Phenytoin Rifampin Teniposide Theophylline Thyroid medications Tolbutamide Warfarin Zileuton This list may not describe all possible interactions. Give your health care provider a list of all the medicines, herbs, non-prescription drugs, or dietary supplements you use. Also tell them if you smoke, drink alcohol, or use illegal drugs. Some items may interact with your medicine. What should I watch for while using this medication? Visit your care team for regular checks on your progress. Check your blood pressure as directed. Ask your care team what your blood pressure should be. Also, find out when you should contact him or her. Do not treat yourself for coughs, colds, or pain while you are using this medication without asking your care team for advice. Some medications may increase your blood pressure. You may get drowsy or dizzy. Do not drive, use machinery, or do anything that needs mental alertness until you know how this medication affects you. Do not stand up or sit up quickly, especially if you are an older patient.  This reduces the risk of dizzy or fainting spells. Alcohol may interfere with the effect of this medication. Avoid alcoholic drinks. This medication may increase blood sugar. Ask your care team if changes in diet or medications are needed if you have diabetes. What side effects may I notice from receiving this medication? Side effects that you should report to your care team as soon as possible: Allergic reactions--skin rash, itching, hives, swelling of the face, lips, tongue, or throat Heart failure--shortness of breath, swelling of the ankles, feet, or hands, sudden weight gain, unusual weakness or fatigue Low blood pressure--dizziness, feeling faint or lightheaded, blurry vision Raynaud's--cool, numb, or painful fingers or toes that may change color from pale, to blue, to red Redness, blistering, peeling, or loosening of the skin, including inside the mouth Slow heartbeat--dizziness, feeling faint or lightheaded, confusion, trouble breathing, unusual weakness or fatigue Worsening mood, feelings of depression Side effects that usually do not require medical attention (report to your care team if they continue or are bothersome): Change in sex drive or performance Diarrhea Dizziness Fatigue Headache This list may not describe all possible side effects. Call your doctor for medical advice about side effects. You may report side effects to FDA at 1-800-FDA-1088. Where should I keep my medication? Keep out of the reach of children and pets. Store at room temperature between 20 and 25 degrees C (68 and 77 degrees F). Protect from light. Throw away any unused medication after the expiration date. NOTE: This sheet is a  summary. It may not cover all possible information. If you have questions about this medicine, talk to your doctor, pharmacist, or health care provider.  2022 Elsevier/Gold Standard (2021-03-15 00:00:00)

## 2021-09-02 ENCOUNTER — Encounter: Payer: Self-pay | Admitting: Neurology

## 2021-09-05 ENCOUNTER — Other Ambulatory Visit: Payer: Self-pay | Admitting: Physician Assistant

## 2021-09-05 DIAGNOSIS — R110 Nausea: Secondary | ICD-10-CM

## 2021-09-05 MED ORDER — ONDANSETRON HCL 4 MG PO TABS: 4.0000 mg | ORAL_TABLET | Freq: Three times a day (TID) | ORAL | 0 refills | Status: DC | PRN

## 2021-09-23 ENCOUNTER — Other Ambulatory Visit: Payer: Self-pay | Admitting: Neurology

## 2021-10-18 ENCOUNTER — Telehealth: Payer: Self-pay | Admitting: Neurology

## 2021-10-18 NOTE — Telephone Encounter (Signed)
LVM and sent mychart msg asking pt to call back and r/s 4/24 appt- Dr. Jaynee Eagles out. ?

## 2021-10-30 IMAGING — US US ABDOMEN LIMITED
1 series · 13 of 25 positions shown · non-contrast
Comparison: None.

CLINICAL DATA: Epigastric pain, nausea

EXAM:
ULTRASOUND ABDOMEN LIMITED RIGHT UPPER QUADRANT

[Series 1: us abdomen limited · 0.14mm/px · 13 of 92 slices shown]
[im 1/92]
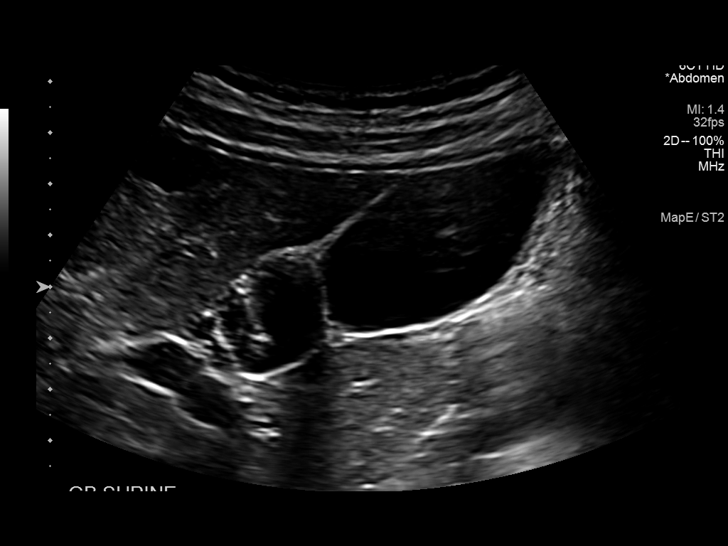
[im 8/92]
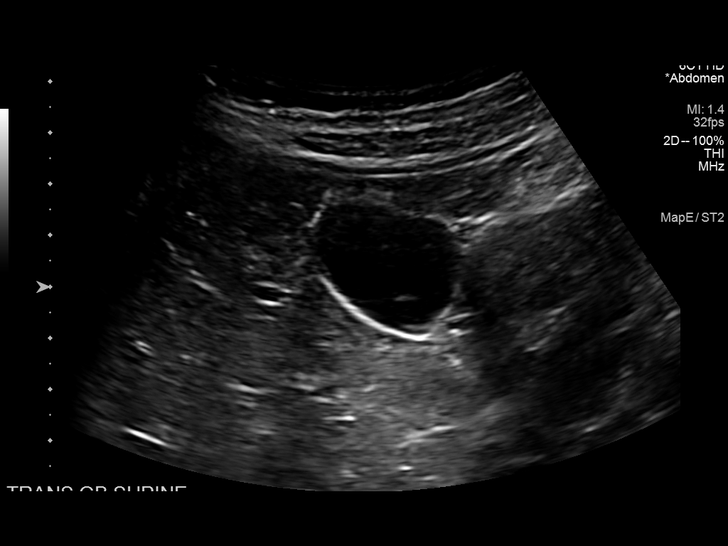
[im 16/92]
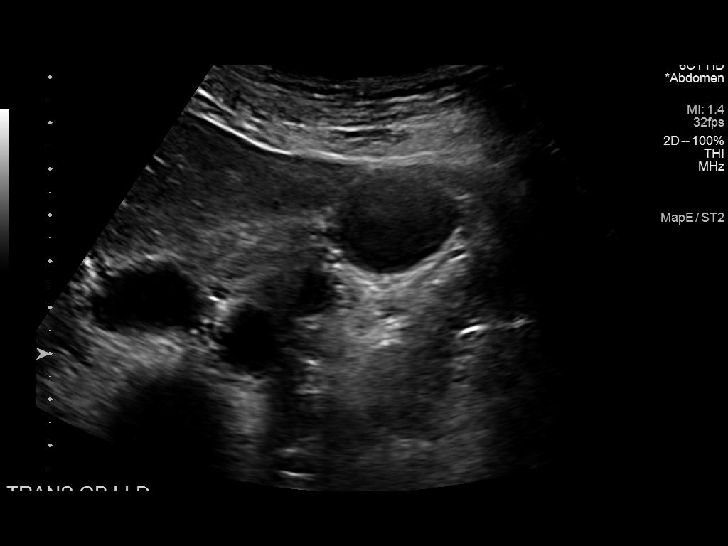
[im 23/92]
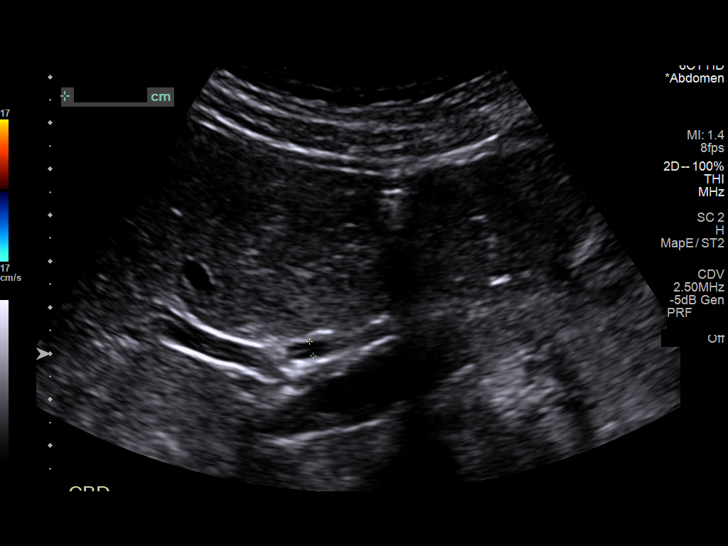
[im 31/92]
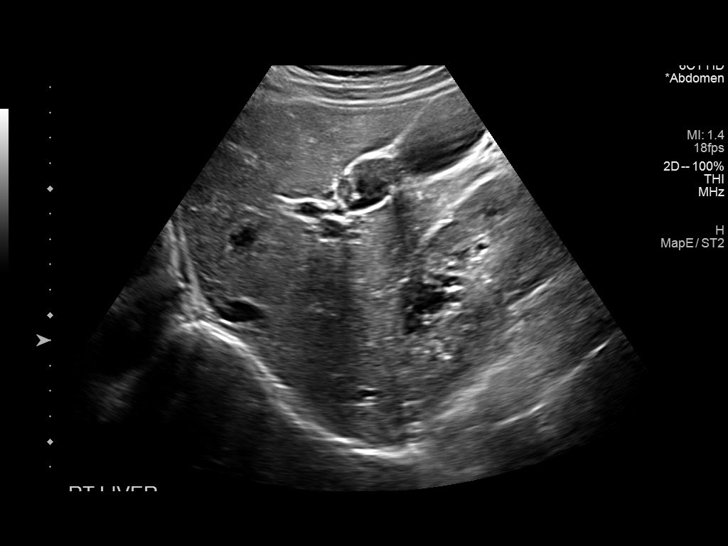
[im 38/92]
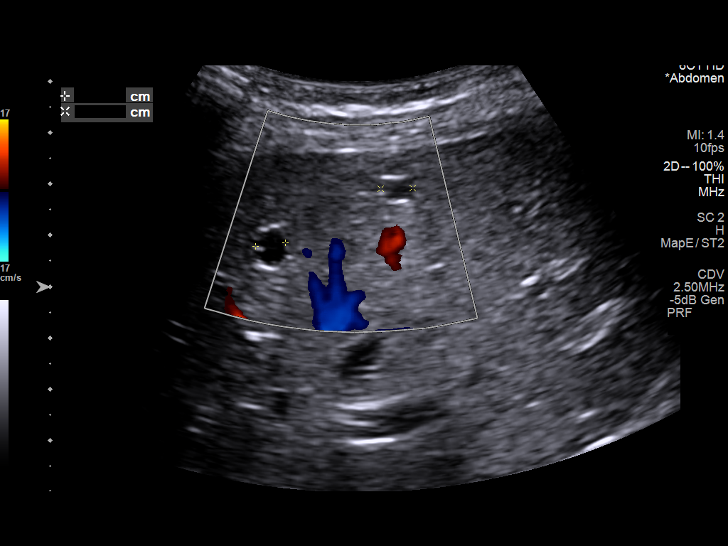
[im 46/92]
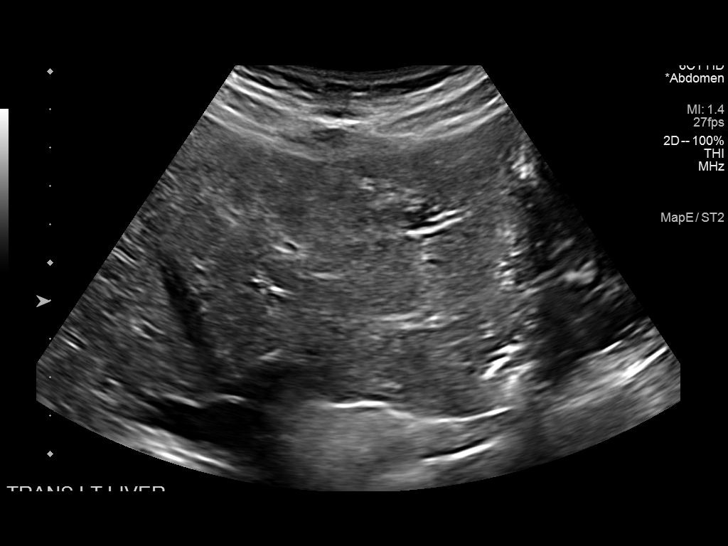
[im 54/92]
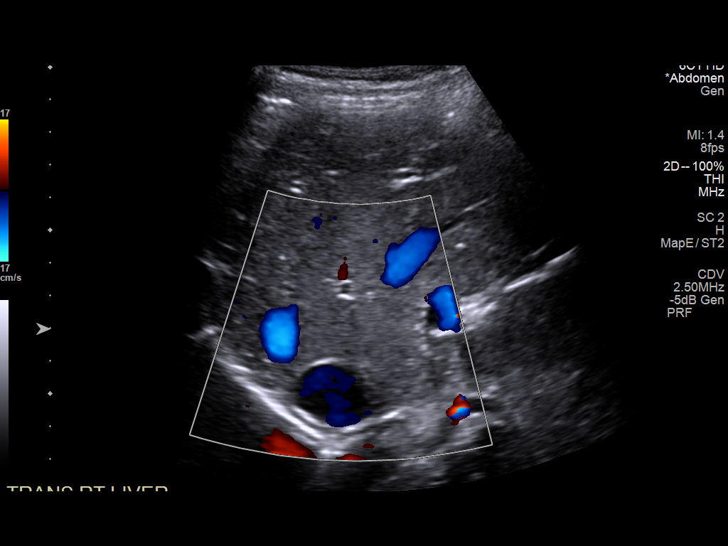
[im 61/92]
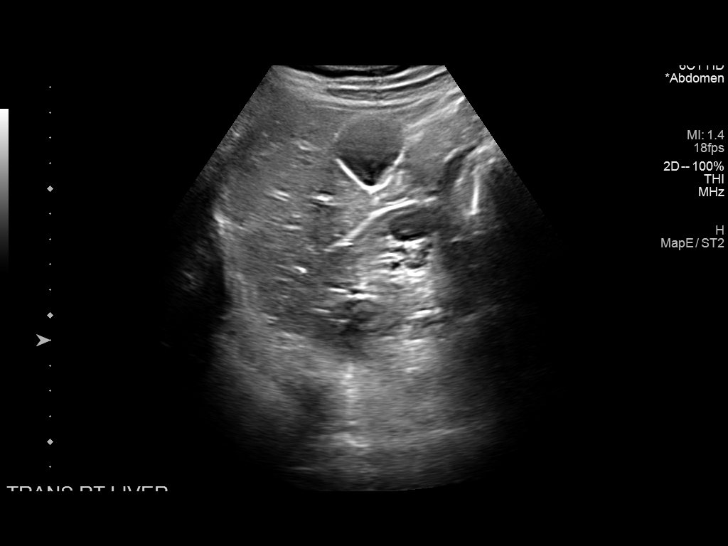
[im 69/92]
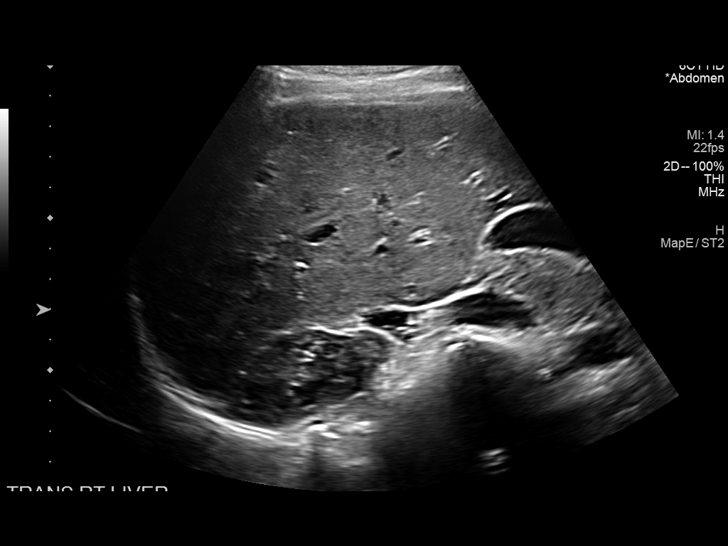
[im 76/92]
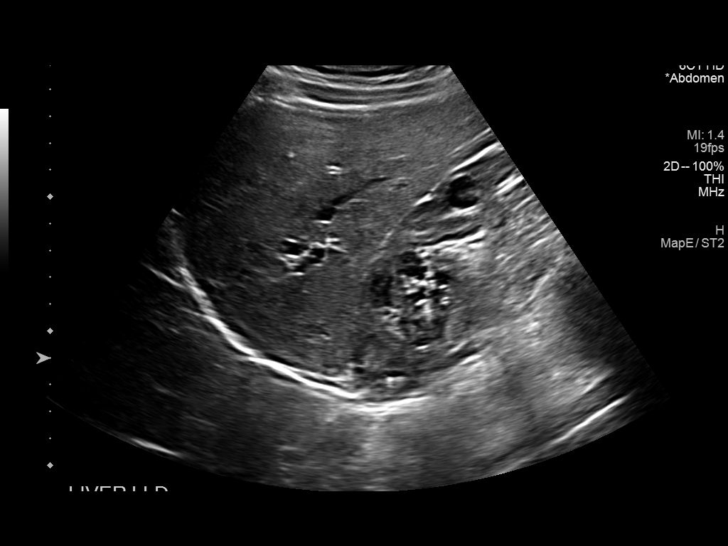
[im 84/92]
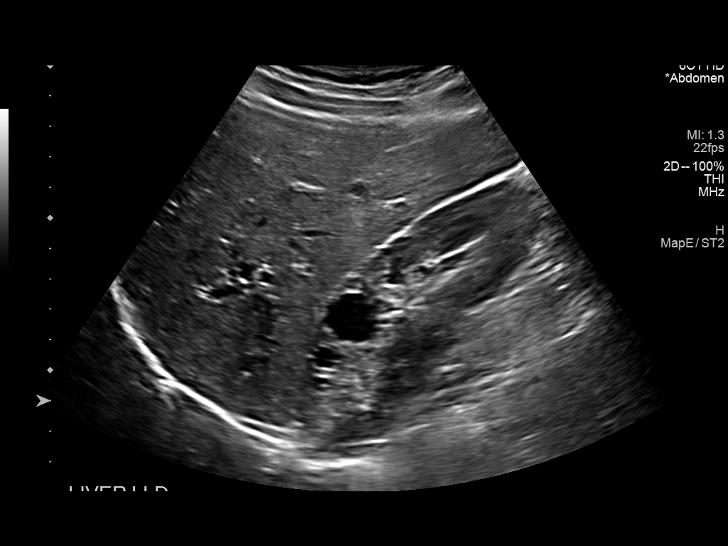
[im 92/92]
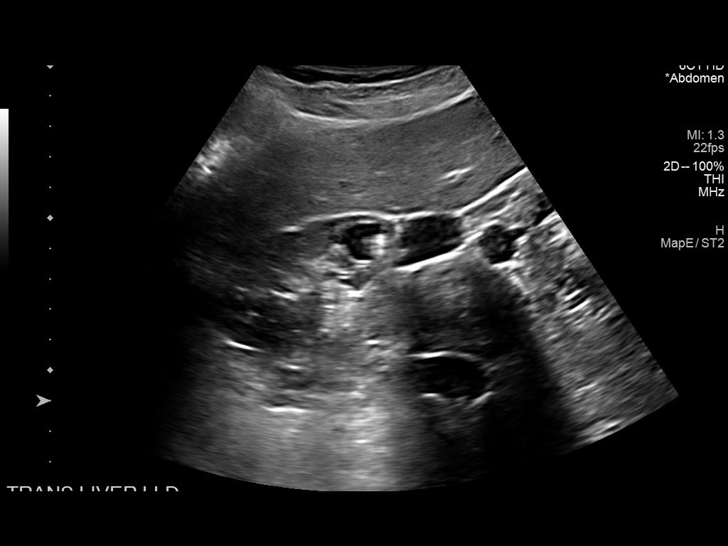

[13 of 25 positions shown; findings below may reference images not displayed]

FINDINGS: Gallbladder:

No gallstones or wall thickening visualized. No sonographic Murphy
sign noted by sonographer.

Common bile duct:

Diameter: 3.2 mm, nondilated.

Liver:

Multitude of small simple appearing cysts present throughout the
liver, largest measuring up to 1 point cm in size without concerning
internal complexity. Normal background parenchymal echogenicity.
Smooth liver surface contour. No visible concerning solid liver
lesion. No intrahepatic biliary ductal dilatation. Portal vein is
patent on color Doppler imaging with normal direction of blood flow
towards the liver.

Other: Numerous renal cysts are incidentally noted within the
partially visualized kidney as well several of which demonstrate
some peripheral echogenicity/possible calcification.
IMPRESSION: Multiple small simple appearing cyst present diffusely throughout
the liver with cysts throughout the partially visualized right
kidney. Such a constellation of findings can be seen with autosomal
dominant polycystic kidney disease.

Otherwise unremarkable right upper quadrant ultrasound.

## 2021-10-31 ENCOUNTER — Ambulatory Visit: Payer: BC Managed Care – PPO | Admitting: Neurology

## 2021-11-05 ENCOUNTER — Other Ambulatory Visit: Payer: Self-pay | Admitting: Neurology

## 2021-11-16 ENCOUNTER — Encounter: Payer: Self-pay | Admitting: Neurology

## 2021-11-16 ENCOUNTER — Ambulatory Visit (INDEPENDENT_AMBULATORY_CARE_PROVIDER_SITE_OTHER): Payer: BC Managed Care – PPO | Admitting: Neurology

## 2021-11-16 VITALS — BP 104/62 | HR 78 | Ht 65.0 in | Wt 138.4 lb

## 2021-11-16 DIAGNOSIS — G43009 Migraine without aura, not intractable, without status migrainosus: Secondary | ICD-10-CM

## 2021-11-16 MED ORDER — UBRELVY 100 MG PO TABS
100.0000 mg | ORAL_TABLET | ORAL | 11 refills | Status: DC | PRN
Start: 2021-11-16 — End: 2022-03-20

## 2021-11-16 MED ORDER — QULIPTA 60 MG PO TABS: 60.0000 mg | ORAL_TABLET | Freq: Every day | ORAL | 6 refills | Status: DC

## 2021-11-16 NOTE — Patient Instructions (Signed)
Continue propranolol ?Stop rizatriptan ?Start atogepant/qulipta: daily for prevention ?Bernita RaisinUbrelvy: as needed for headache or migraine: Please take one tablet at the onset of your headache. If it does not improve the symptoms please take one additional tablet.  ? ?Ubrogepant Tablets ?What is this medication? ?UBROGEPANT (ue BROE je pant) treats migraines. It works by blocking a substance in the body that causes migraines. It is not used to prevent migraines. ?This medicine may be used for other purposes; ask your health care provider or pharmacist if you have questions. ?COMMON BRAND NAME(S): Ubrelvy ?What should I tell my care team before I take this medication? ?They need to know if you have any of these conditions: ?Kidney disease ?Liver disease ?An unusual or allergic reaction to ubrogepant, other medications, foods, dyes, or preservatives ?Pregnant or trying to get pregnant ?Breast-feeding ?How should I use this medication? ?Take this medication by mouth with a glass of water. Take it as directed on the prescription label. You can take it with or without food. If it upsets your stomach, take it with food. Keep taking it unless your care team tells you to stop. ?Talk to your care team about the use of this medication in children. Special care may be needed. ?Overdosage: If you think you have taken too much of this medicine contact a poison control center or emergency room at once. ?NOTE: This medicine is only for you. Do not share this medicine with others. ?What if I miss a dose? ?This does not apply. This medication is not for regular use. ?What may interact with this medication? ?Do not take this medication with any of the following: ?Adagrasib ?Ceritinib ?Certain antibiotics, such as chloramphenicol, clarithromycin, telithromycin ?Certain antivirals for HIV, such as atazanavir, cobicistat, darunavir, delavirdine, fosamprenavir, indinavir, ritonavir ?Certain medications for fungal infections, such as  itraconazole, ketoconazole, posaconazole, voriconazole ?Conivaptan ?Grapefruit ?Idelalisib ?Mifepristone ?Nefazodone ?Ribociclib ?This medication may also interact with the following: ?Carvedilol ?Certain medications for seizures, such as phenobarbital, phenytoin ?Ciprofloxacin ?Cyclosporine ?Eltrombopag ?Fluconazole ?Fluvoxamine ?Quinidine ?Rifampin ?St. John's wort ?Verapamil ?This list may not describe all possible interactions. Give your health care provider a list of all the medicines, herbs, non-prescription drugs, or dietary supplements you use. Also tell them if you smoke, drink alcohol, or use illegal drugs. Some items may interact with your medicine. ?What should I watch for while using this medication? ?Visit your care team for regular checks on your progress. Tell your care team if your symptoms do not start to get better or if they get worse. ?Your mouth may get dry. Chewing sugarless gum or sucking hard candy and drinking plenty of water may help. Contact your care team if the problem does not go away or is severe. ?What side effects may I notice from receiving this medication? ?Side effects that you should report to your care team as soon as possible: ?Allergic reactions--skin rash, itching, hives, swelling of the face, lips, tongue, or throat ?Side effects that usually do not require medical attention (report to your care team if they continue or are bothersome): ?Drowsiness ?Dry mouth ?Fatigue ?Nausea ?This list may not describe all possible side effects. Call your doctor for medical advice about side effects. You may report side effects to FDA at 1-800-FDA-1088. ?Where should I keep my medication? ?Keep out of the reach of children and pets. ?Store between 15 and 30 degrees C (59 and 86 degrees F). Get rid of any unused medication after the expiration date. ?To get rid of medications that are no longer  needed or have expired: ?Take the medication to a medication take-back program. Check with your  pharmacy or law enforcement to find a location. ?If you cannot return the medication, check the label or package insert to see if the medication should be thrown out in the garbage or flushed down the toilet. If you are not sure, ask your care team. If it is safe to put it in the trash, pour the medication out of the container. Mix the medication with cat litter, dirt, coffee grounds, or other unwanted substance. Seal the mixture in a bag or container. Put it in the trash. ?NOTE: This sheet is a summary. It may not cover all possible information. If you have questions about this medicine, talk to your doctor, pharmacist, or health care provider. ?? 2023 Elsevier/Gold Standard (2021-08-19 00:00:00) ?Atogepant Tablets ?What is this medication? ?ATOGEPANT (a TOE je pant) prevents migraines. It works by blocking a substance in the body that causes migraines. ?This medicine may be used for other purposes; ask your health care provider or pharmacist if you have questions. ?COMMON BRAND NAME(S): QULIPTA ?What should I tell my care team before I take this medication? ?They need to know if you have any of these conditions: ?Kidney disease ?Liver disease ?An unusual or allergic reaction to atogepant, other medications, foods, dyes, or preservatives ?Pregnant or trying to get pregnant ?Breast-feeding ?How should I use this medication? ?Take this medication by mouth with water. Take it as directed on the prescription label at the same time every day. You can take it with or without food. If it upsets your stomach, take it with food. Keep taking it unless your care team tells you to stop. ?Talk to your care team about the use of this medication in children. Special care may be needed. ?Overdosage: If you think you have taken too much of this medicine contact a poison control center or emergency room at once. ?NOTE: This medicine is only for you. Do not share this medicine with others. ?What if I miss a dose? ?If you miss a dose,  take it as soon as you can. If it is almost time for your next dose, take only that dose. Do not take double or extra doses. ?What may interact with this medication? ?Carbamazepine ?Certain medications for fungal infections, such as itraconazole, ketoconazole ?Clarithromycin ?Cyclosporine ?Efavirenz ?Etravirine ?Phenytoin ?Rifampin ?St. John's wort ?This list may not describe all possible interactions. Give your health care provider a list of all the medicines, herbs, non-prescription drugs, or dietary supplements you use. Also tell them if you smoke, drink alcohol, or use illegal drugs. Some items may interact with your medicine. ?What should I watch for while using this medication? ?Visit your care team for regular checks on your progress. Tell your care team if your symptoms do not start to get better or if they get worse. ?What side effects may I notice from receiving this medication? ?Side effects that you should report to your care team as soon as possible: ?Allergic reactions--skin rash, itching, hives, swelling of the face, lips, tongue, or throat ?Side effects that usually do not require medical attention (report to your care team if they continue or are bothersome): ?Constipation ?Fatigue ?Loss of appetite with weight loss ?Nausea ?This list may not describe all possible side effects. Call your doctor for medical advice about side effects. You may report side effects to FDA at 1-800-FDA-1088. ?Where should I keep my medication? ?Keep out of the reach of children and pets. ?Store  at room temperature between 20 and 25 degrees C (68 and 77 degrees F). Get rid of any unused medication after the expiration date. ?To get rid of medications that are no longer needed or have expired: ?Take the medication to a medication take-back program. Check with your pharmacy or law enforcement to find a location. ?If you cannot return the medication, check the label or package insert to see if the medication should be thrown  out in the garbage or flushed down the toilet. If you are not sure, ask your care team. If it is safe to put it in the trash, take the medication out of the container. Mix the medication with cat litter, dirt, co

## 2021-11-16 NOTE — Progress Notes (Signed)
?[facility] ? ? ? ?Provider:  Dr Lucia Gaskins ?Requesting Provider: Mayer Masker, PA-C ?Primary Care Provider:  Mayer Masker, PA-C ? ?CC:  Migraines ? ?11/16/2021: Migraines are improved from 15 to 4 a month but still headache but even the headaches are improved in severity. No dizziness. Rizatriptan did not help. Episodic migraines. 4 migraines a month and < 10 total ha days/mo. Tried: propranolol, rizatriptan, sumatriptan, nortriptyline, topiramate is contraindicated due to kidney stones. Her BP is much better now. Cannot increase propranolo due to hypotension. Start atogepant, try [place]. ? ?Medications tried: propranolol, rizatriptan, sumatriptan, nortriptyline, topiramate is contraindicated due to kidney stones.  ? ?Patient complains of symptoms per HPI as well as the following symptoms: headaches . Pertinent negatives and positives per HPI. All others negative ? ? ?HPI:  Alison Ballard is a 21 y.o. female here as requested by Mayer Masker, PA-C for migraines.  Patient has a past medical history of polycystic kidney disease, headache, blood pressure elevation, chronic kidney disease, bilateral nephrolithiasis,, hypertension, chronic kidney disease, she was referred by Vallery Sa at [facility], they ordered a renal ultrasound and MRI of the head, she has seen gastroenterology and they have help with constipation, she is working on fluid intake coat, overall pain is better sometimes doing activity not an issue since early September on a trip.  She was recently diagnosed with PKD and the pain they are referring to his flank pain and fullness of the flank and early satiety.  They have also ordered CT of the abdomen and MRI of the brain with and without contrast.  She states she has headaches.  Imaging could not rule out aneurysm per notes from [facility]. ? ?She has had headaches since the age of 2. Starts in the occipital area and stiffness and then radiates  to the head, pulsating/pounding/throbbing, light and sound sensitivity, moderate to severe in pain, nausea and vomiting, can last 24 hours. They have been getting worse over the last few years, took too much ibuprofen because it seemed to helps and hurt her stomach, on a muscle relaxant helps a little, she has at least 15 headache days a month, 8 migrainous mod to severe ongoing for at least 6  months, no aura. Unknown triggers, a lot of times wakes up with them but no snoring, no excessive daytime fatigue, no numbness or tingling.  A lot of neck tightness. Has been just lving with it. No aura. No other focal neurologic deficits, associated symptoms, inciting events or modifiable factors. ? ?Reviewed notes, labs and imaging from outside physicians, which showed: personally reviewed images and agree(additional 15 minutes in addition):  ? ?September 2022 MRA of the head normal no aneurysm or other vascular abnormality identified ? ?January 31, 2021 MRI with and without contrast showed no evidence of acute intracranial abnormality, small nonenhancing discrete T2 hyperintensities in the right periatrial and left parietal white matter suppressed with FLAIR imaging and may represent benign dilated perivascular spaces, benign cysts, or less likely the sequelae of prior insult.  He does note an aneurysm and is not excluded on this study. (I reviewed images with patient and mother as well, feel findings above are benign) ? ?01/31/2021: CT abdomen pelvis with and without contrast findings most consistent with polycystic kidney liver disease, bilateral nephrolithiasis without obstructive uropathy, constipation, left ovarian corpus luteal cyst. ? ? ?Review of Systems: ?Patient complains of symptoms per HPI as well as the following symptoms headaches. Pertinent negatives and positives per HPI.  All others negative. ? ? ?Social History  ? ?Socioeconomic History  ? Marital status: Single  ?  Spouse name: Not on file  ? Number of  children: 0  ? Years of education: Not on file  ? Highest education level: Not on file  ?Occupational History  ? Occupation: Consulting civil engineer, receptionist  ?Tobacco Use  ? Smoking status: Never  ? Smokeless tobacco: Never  ?Vaping Use  ? Vaping Use: Never used  ?Substance and Sexual Activity  ? Alcohol use: Never  ? Drug use: Never  ? Sexual activity: Not Currently  ?Other Topics Concern  ? Not on file  ?Social History Narrative  ? Caffeine 1 dink daily.  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 ?Social Determinants of Health  ? ?Financial Resource Strain: Not on file  ?Food Insecurity: Not on file  ?Transportation Needs: Not on file  ?Physical Activity: Not on file  ?Stress: Not on file  ?Social Connections: Not on file  ?Intimate Partner Violence: Not on file  ? ? ?Family History  ?Problem Relation Age of Onset  ? Hypertension Mother   ? Healthy Mother   ? Hyperlipidemia Father   ? Healthy Father   ? Ovarian cysts Paternal Aunt   ? Migraines Neg Hx   ? ? ?Past Medical History:  ?Diagnosis Date  ? Brain cyst   ? Kidney stones   ? Ovarian cyst   ? Polycystic kidney disease   ? Polycystic liver disease   ? ? ?Patient Active Problem List  ? Diagnosis Date Noted  ? Migraine without aura and without status migrainosus, not intractable 09/01/2021  ? Constipation 02/02/2021  ? Nausea with vomiting 02/02/2021  ? Polycystic kidney disease 01/18/2021  ? ? ?Past Surgical History:  ?Procedure Laterality Date  ? TONSILLECTOMY AND ADENOIDECTOMY    ? ? ?Current Outpatient Medications  ?Medication Sig Dispense Refill  ? Atogepant (QULIPTA) 60 MG TABS Take 60 mg by mouth daily. 30 tablet 6  ? [name] tablet Take 1 tablet by mouth daily.    ? methocarbamol (ROBAXIN) 500 MG tablet Take 1 tablet (500 mg total) by mouth 2 (two) times daily as needed for muscle spasms. 30 tablet 0  ? ondansetron (ZOFRAN) 4 MG tablet Take 1 tablet (4 mg total) by mouth every 8 (eight) hours as needed for nausea or vomiting. 20 tablet  0  ? propranolol ER (INDERAL LA) 60 MG 24 hr capsule Take 1 capsule (60 mg total) by mouth at bedtime. 30 capsule 2  ? Ubrogepant (UBRELVY) 100 MG TABS Take 100 mg by mouth every 2 (two) hours as needed. Maximum 200mg  a day. 16 tablet 11  ? ?No current facility-administered medications for this visit.  ? ? ?Allergies as of 11/16/2021  ? (No Known Allergies)  ? ? ?Vitals: ?BP 104/62   Pulse 78   Ht 5\' 5"  (1.651 m)   Wt 138 lb 6.4 oz (62.8 kg)   BMI 23.03 kg/m?  ?Last Weight:  ?Wt Readings from Last 1 Encounters:  ?11/16/21 138 lb 6.4 oz (62.8 kg) (67 %, Z= 0.45)*  ? ?* Growth percentiles are based on CDC (Girls, 2-20 Years) data.  ? ?Last Height:   ?Ht Readings from Last 1 Encounters:  ?11/16/21 5\' 5"  (1.651 m) (61 %, Z= 0.28)*  ? ?* Growth percentiles are based on CDC (Girls, 2-20 Years) data.  ? ?Exam: ?NAD, pleasant                  ?  Speech: ?   Speech is normal; fluent and spontaneous with normal comprehension.  ?Cognition: ?   The patient is oriented to person, place, and time;  ?   recent and remote memory intact;  ?   language fluent;  ?  Cranial Nerves: ?   The pupils are equal, round, and reactive to light.Trigeminal sensation is intact and the muscles of mastication are normal. The face is symmetric. The palate elevates in the midline. Hearing intact. Voice is normal. Shoulder shrug is normal. The tongue has normal motion without fasciculations.  ? ?Coordination: ? No dysmetria ? ?Motor Observation: ?   No asymmetry, no atrophy, and no involuntary movements noted. ?Tone: ?   Normal muscle tone.   ?  ?Strength: ?   Strength is V/V in the upper and lower limbs.  ?    ?Sensation: intact to LT ? ? ?  ? ?Assessment/Plan:  Patient with chronic migraines, we spent quite a long time talking about migraines, migraine management, lifestyle factors, triggers, she is new to migraine management.  We talked about triptans, other acute and preventative medications, the new CGRP and other classes of medications.   Mother accompanies and also provides information. ? ?Migraines are improved on propranolol from 15 to 4 a month but still headache but even the headaches are improved in severity. No dizziness. Rizatriptan did not

## 2021-11-24 ENCOUNTER — Encounter: Payer: Self-pay | Admitting: *Deleted

## 2021-11-24 ENCOUNTER — Telehealth: Payer: Self-pay | Admitting: *Deleted

## 2021-11-24 NOTE — Telephone Encounter (Signed)
Bernita Raisin PA, Key: [id number]. Your information has been sent to [facility].

## 2021-11-24 NOTE — Telephone Encounter (Signed)
Approved today Request Reference Number: [id number]. UBRELVY TAB 100MG  is approved through 02/24/2022. Your patient may now fill this prescription and it will be covered.  Sent her my chart to advise.

## 2022-01-18 ENCOUNTER — Ambulatory Visit: Payer: BC Managed Care – PPO | Admitting: Internal Medicine

## 2022-01-20 ENCOUNTER — Ambulatory Visit (INDEPENDENT_AMBULATORY_CARE_PROVIDER_SITE_OTHER): Payer: BC Managed Care – PPO | Admitting: Gastroenterology

## 2022-01-20 ENCOUNTER — Encounter: Payer: Self-pay | Admitting: Gastroenterology

## 2022-01-20 VITALS — BP 109/68 | HR 70 | Ht 65.0 in | Wt 130.5 lb

## 2022-01-20 DIAGNOSIS — Q446 Cystic disease of liver: Secondary | ICD-10-CM

## 2022-01-20 DIAGNOSIS — K219 Gastro-esophageal reflux disease without esophagitis: Secondary | ICD-10-CM

## 2022-01-20 DIAGNOSIS — R109 Unspecified abdominal pain: Secondary | ICD-10-CM | POA: Diagnosis not present

## 2022-01-20 MED ORDER — PANTOPRAZOLE SODIUM 20 MG PO TBEC: 20.0000 mg | DELAYED_RELEASE_TABLET | Freq: Every day | ORAL | 6 refills | Status: DC

## 2022-01-20 MED ORDER — DICYCLOMINE HCL 10 MG PO CAPS: 10.0000 mg | ORAL_CAPSULE | Freq: Three times a day (TID) | ORAL | 3 refills | Status: DC | PRN

## 2022-01-20 NOTE — Patient Instructions (Signed)
You will be contacted by [facility] in the next 2 days to arrange a RUQ Ultrasound and Renal Ultrasound.  The number on your caller ID will be [phone], please answer when they call.  If you have not heard from them in 2 days please call [phone] to schedule.     We have sent the following medications to your pharmacy for you to pick up at your convenience:  Pantoprazole and Bentyl  Gastroesophageal Reflux Disease, Adult Gastroesophageal reflux (GER) happens when acid from the stomach flows up into the tube that connects the mouth and the stomach (esophagus). Normally, food travels down the esophagus and stays in the stomach to be digested. However, when a person has GER, food and stomach acid sometimes move back up into the esophagus. If this becomes a more serious problem, the person may be diagnosed with a disease called gastroesophageal reflux disease (GERD). GERD occurs when the reflux: Happens often. Causes frequent or severe symptoms. Causes problems such as damage to the esophagus. When stomach acid comes in contact with the esophagus, the acid may cause inflammation in the esophagus. Over time, GERD may create small holes (ulcers) in the lining of the esophagus. What are the causes? This condition is caused by a problem with the muscle between the esophagus and the stomach (lower esophageal sphincter, or LES). Normally, the LES muscle closes after food passes through the esophagus to the stomach. When the LES is weakened or abnormal, it does not close properly, and that allows food and stomach acid to go back up into the esophagus. The LES can be weakened by certain dietary substances, medicines, and medical conditions, including: Tobacco use. Pregnancy. Having a hiatal hernia. Alcohol use. Certain foods and beverages, such as coffee, chocolate, onions, and peppermint. What increases the risk? You are more likely to develop this condition if you: Have an  increased body weight. Have a connective tissue disorder. Take NSAIDs, such as ibuprofen. What are the signs or symptoms? Symptoms of this condition include: Heartburn. Difficult or painful swallowing and the feeling of having a lump in the throat. A bitter taste in the mouth. Bad breath and having a large amount of saliva. Having an upset or bloated stomach and belching. Chest pain. Different conditions can cause chest pain. Make sure you see your health care provider if you experience chest pain. Shortness of breath or wheezing. Ongoing (chronic) cough or a nighttime cough. Wearing away of tooth enamel. Weight loss. How is this diagnosed? This condition may be diagnosed based on a medical history and a physical exam. To determine if you have mild or severe GERD, your health care provider may also monitor how you respond to treatment. You may also have tests, including: A test to examine your stomach and esophagus with a small camera (endoscopy). A test that measures the acidity level in your esophagus. A test that measures how much pressure is on your esophagus. A barium swallow or modified barium swallow test to show the shape, size, and functioning of your esophagus. How is this treated? Treatment for this condition may vary depending on how severe your symptoms are. Your health care provider may recommend: Changes to your diet. Medicine. Surgery. The goal of treatment is to help relieve your symptoms and to prevent complications. Follow these instructions at home: Eating and drinking  Follow a diet as recommended by your health care provider. This may involve avoiding foods and drinks such as: Coffee and tea, with or without caffeine.  Drinks that contain alcohol. Energy drinks and sports drinks. Carbonated drinks or sodas. Chocolate and cocoa. Peppermint and mint flavorings. Garlic and onions. Horseradish. Spicy and acidic foods, including peppers, chili powder, curry  powder, vinegar, hot sauces, and barbecue sauce. Citrus fruit juices and citrus fruits, such as oranges, lemons, and limes. Tomato-based foods, such as red sauce, chili, salsa, and pizza with red sauce. Fried and fatty foods, such as donuts, french fries, potato chips, and high-fat dressings. High-fat meats, such as hot dogs and fatty cuts of red and white meats, such as rib eye steak, sausage, ham, and bacon. High-fat dairy items, such as whole milk, butter, and cream cheese. Eat small, frequent meals instead of large meals. Avoid drinking large amounts of liquid with your meals. Avoid eating meals during the 2-3 hours before bedtime. Avoid lying down right after you eat. Do not exercise right after you eat. Lifestyle  Do not use any products that contain nicotine or tobacco. These products include cigarettes, chewing tobacco, and vaping devices, such as e-cigarettes. If you need help quitting, ask your health care provider. Try to reduce your stress by using methods such as yoga or meditation. If you need help reducing stress, ask your health care provider. If you are overweight, reduce your weight to an amount that is healthy for you. Ask your health care provider for guidance about a safe weight loss goal. General instructions Pay attention to any changes in your symptoms. Take over-the-counter and prescription medicines only as told by your health care provider. Do not take aspirin, ibuprofen, or other NSAIDs unless your health care provider told you to take these medicines. Wear loose-fitting clothing. Do not wear anything tight around your waist that causes pressure on your abdomen. Raise (elevate) the head of your bed about 6 inches (15 cm). You can use a wedge to do this. Avoid bending over if this makes your symptoms worse. Keep all follow-up visits. This is important. Contact a health care provider if: You have: New symptoms. Unexplained weight loss. Difficulty swallowing or it  hurts to swallow. Wheezing or a persistent cough. A hoarse voice. Your symptoms do not improve with treatment. Get help right away if: You have sudden pain in your arms, neck, jaw, teeth, or back. You suddenly feel sweaty, dizzy, or light-headed. You have chest pain or shortness of breath. You vomit and the vomit is green, yellow, or black, or it looks like blood or coffee grounds. You faint. You have stool that is red, bloody, or black. You cannot swallow, drink, or eat. These symptoms may represent a serious problem that is an emergency. Do not wait to see if the symptoms will go away. Get medical help right away. Call your local emergency services (911 in the [place] Do not drive yourself to the hospital. Summary Gastroesophageal reflux happens when acid from the stomach flows up into the esophagus. GERD is a disease in which the reflux happens often, causes frequent or severe symptoms, or causes problems such as damage to the esophagus. Treatment for this condition may vary depending on how severe your symptoms are. Your health care provider may recommend diet and lifestyle changes, medicine, or surgery. Contact a health care provider if you have new or worsening symptoms. Take over-the-counter and prescription medicines only as told by your health care provider. Do not take aspirin, ibuprofen, or other NSAIDs unless your health care provider told you to do so. Keep all follow-up visits as told by your health care provider. This  is important. This information is not intended to replace advice given to you by your health care provider. Make sure you discuss any questions you have with your health care provider. Document Revised: 01/05/2020 Document Reviewed: 01/05/2020 Elsevier Patient Education  2023 [employer].  Due to recent changes in healthcare laws, you may see the results of your imaging and laboratory studies on MyChart before your provider has had a chance to review them.  We  understand that in some cases there may be results that are confusing or concerning to you. Not all laboratory results come back in the same time frame and the provider may be waiting for multiple results in order to interpret others.  Please give [place] 48 hours in order for your provider to thoroughly review all the results before contacting the office for clarification of your results.    I appreciate the  opportunity to care for you  Thank You   Marsa Aris , MD

## 2022-01-20 NOTE — Progress Notes (Signed)
Alison Ballard    [id number]    05/26/2001  Primary Care Physician:Abonza, Genia Plants  Referring Physician: Mayer Masker, PA-C [id number] [place],  [place] [address]   Chief complaint: Constipation, right flank pain  HPI:  21 year old very pleasant female here for follow-up visit accompanied by her mother for chronic constipation.  Complains of intermittent symptoms from constipation.  She has constant right flank pain radiating to the back She has history of polycystic kidney and liver disease. She is having daily bowel movement with MiraLAX   She is having intermittent heartburn.  Denies any dysphagia, odynophagia, abdominal pain, melena or rectal bleeding.  No unintentional weight loss or decreased appetite.   CT abdomen pelvis with and without contrast January 31, 2021 1. Findings most consistent with polycystic kidney/liver disease. 2. Bilateral nephrolithiasis without obstructive uropathy. 3.  Possible constipation. 4. Left ovarian corpus luteal cyst   Outpatient Encounter Medications as of 01/20/2022  Medication Sig   Atogepant (QULIPTA) 60 MG TABS Take 60 mg by mouth daily.   [name] tablet Take 1 tablet by mouth daily.   methocarbamol (ROBAXIN) 500 MG tablet Take 1 tablet (500 mg total) by mouth 2 (two) times daily as needed for muscle spasms.   ondansetron (ZOFRAN) 4 MG tablet Take 1 tablet (4 mg total) by mouth every 8 (eight) hours as needed for nausea or vomiting.   propranolol ER (INDERAL LA) 60 MG 24 hr capsule Take 1 capsule (60 mg total) by mouth at bedtime.   Ubrogepant (UBRELVY) 100 MG TABS Take 100 mg by mouth every 2 (two) hours as needed. Maximum 200mg  a day.   No facility-administered encounter medications on file as of 01/20/2022.    Allergies as of 01/20/2022   (No Known Allergies)    Past Medical History:  Diagnosis Date   Brain cyst    Kidney stones    Ovarian cyst    Polycystic kidney disease     Polycystic liver disease     Past Surgical History:  Procedure Laterality Date   TONSILLECTOMY AND ADENOIDECTOMY      Family History  Problem Relation Age of Onset   Hypertension Mother    Healthy Mother    Hyperlipidemia Father    Healthy Father    Ovarian cysts Paternal Aunt    Migraines Neg Hx     Social History   Socioeconomic History   Marital status: Single    Spouse name: Not on file   Number of children: 0   Years of education: Not on file   Highest education level: Not on file  Occupational History   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   Physical Exam: Vitals:   01/20/22 1507  BP: 109/68  Pulse: 70   Body mass index is 21.72 kg/m. Gen:      No acute distress HEENT:  sclera anicteric Abd:      soft, right flank tenderness /right upper quadrant; no palpable masses, no distension Ext:    No edema Neuro: alert and oriented x 3 Psych: normal mood and affect  Data Reviewed:  Reviewed labs, radiology imaging, old records and pertinent past GI work up   Assessment and Plan/Recommendations:  21 year old very pleasant female with polycystic kidney and liver disease here for follow-up of chronic idiopathic constipation and GERD with complaints of  right-sided abdominal pain radiating to the flank  Right upper quadrant and right flank pain: We will obtain renal and right upper quadrant ultrasound to further evaluate, will need to exclude gallbladder disease or worsening polycystic kidney and liver disease   Chronic idiopathic constipation: Improved with daily MiraLAX, continue with bowel regimen Continue with high-fiber diet and water intake   GERD and nausea: Symptoms have significantly improved with daily pantoprazole Continue antireflux measures Use pantoprazole 20 mg daily as needed  Use dicyclomine 10 mg up to 3 times daily as needed for severe abdominal discomfort or cramping    This visit required >40 minutes of patient care (this includes precharting, chart review, review of results, face-to-face time used for counseling as well as treatment plan and follow-up. The patient was provided an opportunity to ask questions and all were answered. The patient agreed with the plan and demonstrated an understanding of the instructions.  Iona Beard , MD    CC: Mayer Masker, PA-C

## 2022-01-26 ENCOUNTER — Telehealth: Payer: Self-pay

## 2022-01-26 NOTE — Telephone Encounter (Signed)
PA for qulipta has been sent: (Key: [id number])  Your information has been sent to [facility].

## 2022-01-30 NOTE — Telephone Encounter (Signed)
PA for Alison Ballard has been approved. Approvedon July 20 Request Reference Number: [id number]. QULIPTA TAB 60MG  is approved through 07/29/2022. Your patient may now fill this prescription and it will be covered.

## 2022-01-31 ENCOUNTER — Other Ambulatory Visit: Payer: Self-pay | Admitting: Gastroenterology

## 2022-01-31 ENCOUNTER — Encounter (HOSPITAL_COMMUNITY): Payer: Self-pay

## 2022-01-31 ENCOUNTER — Ambulatory Visit (HOSPITAL_COMMUNITY): Admission: RE | Admit: 2022-01-31 | Payer: BC Managed Care – PPO | Source: Ambulatory Visit

## 2022-01-31 ENCOUNTER — Ambulatory Visit (HOSPITAL_COMMUNITY)
Admission: RE | Admit: 2022-01-31 | Discharge: 2022-01-31 | Disposition: A | Discharge status: Home / Self Care | Payer: BC Managed Care – PPO | Source: Ambulatory Visit | Attending: Gastroenterology | Admitting: Gastroenterology

## 2022-01-31 DIAGNOSIS — K219 Gastro-esophageal reflux disease without esophagitis: Secondary | ICD-10-CM | POA: Diagnosis not present

## 2022-01-31 DIAGNOSIS — R109 Unspecified abdominal pain: Secondary | ICD-10-CM | POA: Insufficient documentation

## 2022-01-31 DIAGNOSIS — K8020 Calculus of gallbladder without cholecystitis without obstruction: Secondary | ICD-10-CM | POA: Diagnosis not present

## 2022-01-31 DIAGNOSIS — Q446 Cystic disease of liver: Secondary | ICD-10-CM | POA: Insufficient documentation

## 2022-01-31 DIAGNOSIS — N281 Cyst of kidney, acquired: Secondary | ICD-10-CM | POA: Diagnosis not present

## 2022-02-06 ENCOUNTER — Encounter: Payer: Self-pay | Admitting: Gastroenterology

## 2022-02-14 ENCOUNTER — Other Ambulatory Visit: Payer: Self-pay | Admitting: Neurology

## 2022-03-16 ENCOUNTER — Other Ambulatory Visit: Payer: Self-pay | Admitting: Neurology

## 2022-03-20 ENCOUNTER — Telehealth (INDEPENDENT_AMBULATORY_CARE_PROVIDER_SITE_OTHER): Payer: BC Managed Care – PPO | Admitting: Neurology

## 2022-03-20 ENCOUNTER — Telehealth: Payer: Self-pay | Admitting: Neurology

## 2022-03-20 DIAGNOSIS — G43009 Migraine without aura, not intractable, without status migrainosus: Secondary | ICD-10-CM

## 2022-03-20 MED ORDER — QULIPTA 60 MG PO TABS: 60.0000 mg | ORAL_TABLET | Freq: Every day | ORAL | 6 refills | Status: DC

## 2022-03-20 MED ORDER — PROPRANOLOL HCL ER 60 MG PO CP24: 60.0000 mg | ORAL_CAPSULE | Freq: Every day | ORAL | 11 refills | Status: DC

## 2022-03-20 MED ORDER — UBRELVY 100 MG PO TABS: 100.0000 mg | ORAL_TABLET | ORAL | 11 refills | Status: DC | PRN

## 2022-03-20 NOTE — Telephone Encounter (Signed)
Unable to reach pt over the phone, sent mychart msg asking pt to call back to reschedule

## 2022-03-20 NOTE — Addendum Note (Signed)
Addended by: Naomie Dean B on: 03/20/2022 03:54 PM   Modules accepted: Orders

## 2022-03-20 NOTE — Progress Notes (Addendum)
I was running late due to a patient with significant medical condition,called patient to see if she could still have appointment, no answer will try again or reschedule with me or NP. Refilled meds.

## 2022-03-20 NOTE — Telephone Encounter (Signed)
I was running late due to a patient with significant medical condition,called patient to see if she could still have appointment this week or later today, no answer will try again or reschedule with me or NP - can you call and see what we cn do? thanks

## 2022-03-28 ENCOUNTER — Encounter: Payer: Self-pay | Admitting: Gastroenterology

## 2022-03-28 ENCOUNTER — Ambulatory Visit (INDEPENDENT_AMBULATORY_CARE_PROVIDER_SITE_OTHER): Payer: BC Managed Care – PPO | Admitting: Gastroenterology

## 2022-03-28 VITALS — BP 108/70 | HR 72 | Ht 65.0 in | Wt 129.2 lb

## 2022-03-28 DIAGNOSIS — K5904 Chronic idiopathic constipation: Secondary | ICD-10-CM

## 2022-03-28 DIAGNOSIS — Q613 Polycystic kidney, unspecified: Secondary | ICD-10-CM

## 2022-03-28 DIAGNOSIS — R1013 Epigastric pain: Secondary | ICD-10-CM

## 2022-03-28 DIAGNOSIS — Q446 Cystic disease of liver: Secondary | ICD-10-CM

## 2022-03-28 DIAGNOSIS — K219 Gastro-esophageal reflux disease without esophagitis: Secondary | ICD-10-CM | POA: Diagnosis not present

## 2022-03-28 DIAGNOSIS — K8020 Calculus of gallbladder without cholecystitis without obstruction: Secondary | ICD-10-CM

## 2022-03-28 MED ORDER — PANTOPRAZOLE SODIUM 20 MG PO TBEC: 20.0000 mg | DELAYED_RELEASE_TABLET | Freq: Every day | ORAL | 3 refills | Status: DC

## 2022-03-28 MED ORDER — DICYCLOMINE HCL 10 MG PO CAPS: 10.0000 mg | ORAL_CAPSULE | Freq: Three times a day (TID) | ORAL | 3 refills | Status: DC | PRN

## 2022-03-28 NOTE — Patient Instructions (Signed)
We have sent the following medications to your pharmacy for you to pick up at your convenience: Pantoprazole and Dicyclomine.  You have been scheduled for an endoscopy. Please follow written instructions given to you at your visit today. If you use inhalers (even only as needed), please bring them with you on the day of your procedure.  _______________________________________________________  If you are age 19 or older, your body mass index should be between 23-30. Your Body mass index is 21.51 kg/m. If this is out of the aforementioned range listed, please consider follow up with your Primary Care Provider.  If you are age 64 or younger, your body mass index should be between 19-25. Your Body mass index is 21.51 kg/m. If this is out of the aformentioned range listed, please consider follow up with your Primary Care Provider.   ________________________________________________________  The [facility] providers would like to encourage you to use [facility] to communicate with providers for non-urgent requests or questions.  Due to long hold times on the telephone, sending your provider a message by [facility] may be a faster and more efficient way to get a response.  Please allow 48 business hours for a response.  Please remember that this is for non-urgent requests.  _______________________________________________________

## 2022-03-28 NOTE — Progress Notes (Signed)
Alison Ballard    [id number]    2001/04/13  Primary Care Physician:Abonza, Samantha Crimes  Referring Physician: Lorrene Reid, PA-C [address],  [place] [address]   Chief complaint:  GERD, epigastric pain  HPI:  21 year old very pleasant female here for follow-up visit accompanied by her mother for intermittent epigastric abdominal pain, GERD and chronic constipation.   She continues to have intermittent epigastric abdominal pain and breakthrough heartburn despite taking daily pantoprazole.  Overall she feels symptoms are slightly improved, no longer has abdominal discomfort on regular basis, has occasional episodes which improved with dicyclomine.  On average she is having a bowel movement daily or every other day She has history of polycystic kidney and liver disease.   Denies any dysphagia, odynophagia, abdominal pain, melena or rectal bleeding.  No unintentional weight loss or decreased appetite but continues to struggle gaining weight.  Abdominal ultrasound January 31, 2022 1. Cholelithiasis in an otherwise normal appearing gallbladder. 2. Numerous cysts throughout the liver. No follow-up imaging recommended. 3. No other significant abnormalities.   CT abdomen pelvis with and without contrast January 31, 2021 1. Findings most consistent with polycystic kidney/liver disease. 2. Bilateral nephrolithiasis without obstructive uropathy. 3.  Possible constipation. 4. Left ovarian corpus luteal cyst   Outpatient Encounter Medications as of 03/28/2022  Medication Sig   Atogepant (QULIPTA) 60 MG TABS Take 60 mg by mouth daily.   dicyclomine (BENTYL) 10 MG capsule Take 1 capsule (10 mg total) by mouth every 8 (eight) hours as needed for spasms.   [name] tablet Take 1 tablet by mouth daily.   pantoprazole (PROTONIX) 20 MG tablet Take 1 tablet (20 mg total) by mouth daily.   propranolol ER (INDERAL LA) 60 MG 24 hr capsule Take 1  capsule (60 mg total) by mouth at bedtime.   Ubrogepant (UBRELVY) 100 MG TABS Take 100 mg by mouth every 2 (two) hours as needed. Maximum 200mg  a day.   No facility-administered encounter medications on file as of 03/28/2022.    Allergies as of 03/28/2022   (No Known Allergies)    Past Medical History:  Diagnosis Date   Brain cyst    Kidney stones    Ovarian cyst    Polycystic kidney disease    Polycystic liver disease     Past Surgical History:  Procedure Laterality Date   TONSILLECTOMY AND ADENOIDECTOMY      Family History  Problem Relation Age of Onset   Hypertension Mother    Healthy Mother    Hyperlipidemia Father    Healthy Father    Ovarian cysts Paternal Aunt    Migraines Neg Hx     Social History   Socioeconomic History   Marital status: Single    Spouse name: Not on file   Number of children: 0   Years of education: Not on file   Highest education level: Not on file  Occupational History   Occupation: Ship broker, receptionist  Tobacco Use   Smoking status: Never   Smokeless tobacco: Never  Vaping Use   Vaping Use: Never used  Substance and Sexual Activity   Alcohol use: Never   Drug use: Never   Sexual activity: Not Currently  Other Topics Concern   Not on file  Social History Narrative   Caffeine 1 dink daily.  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   Physical Exam: Vitals:   03/28/22 1530  BP: 108/70  Pulse: 72   Body mass index is 21.51 kg/m. Gen:      No acute distress HEENT:  sclera anicteric Abd:      soft, non-tender; no palpable masses, no distension Ext:    No  edema Neuro: alert and oriented x 3 Psych: normal mood and affect  Data Reviewed:  Reviewed labs, radiology imaging, old records and pertinent past GI work up   Assessment and Plan/Recommendations:  21 year old very pleasant female with polycystic kidney and liver disease here for follow-up with persistent breakthrough remittent heartburn and epigastric abdominal discomfort despite daily PPI  Will schedule EGD to exclude erosive gastroduodenitis or peptic ulcer disease The risks and benefits as well as alternatives of endoscopic procedure(s) have been discussed and reviewed. All questions answered. The patient agrees to proceed.   If EGD unremarkable and has worsening symptoms, may need to consider cholecystectomy.  She has mild cholelithiasis with normal-appearing gallbladder, negative for any signs of cholecystitis.  We will continue to monitor for now  Polycystic liver and kidney disease: Stable, continue to monitor.  Plan for repeat abdominal ultrasound in 2 years    Chronic idiopathic constipation: Improved with daily MiraLAX, continue with bowel regimen Continue with high-fiber diet and water intake  Use dicyclomine 10 mg up to 3 times daily as needed for IBS symptoms with generalized abdominal discomfort or cramping  Return in 1 year or sooner if needed  The patient was provided an opportunity to ask questions and all were answered. The patient agreed with the plan and demonstrated an understanding of the instructions.  Iona Beard , MD    CC: Mayer Masker, PA-C

## 2022-03-29 ENCOUNTER — Ambulatory Visit (AMBULATORY_SURGERY_CENTER): Payer: BC Managed Care – PPO | Admitting: Gastroenterology

## 2022-03-29 ENCOUNTER — Encounter: Payer: Self-pay | Admitting: Gastroenterology

## 2022-03-29 VITALS — BP 109/71 | HR 61 | Temp 98.6°F | Resp 12 | Ht 65.0 in | Wt 129.0 lb

## 2022-03-29 DIAGNOSIS — K449 Diaphragmatic hernia without obstruction or gangrene: Secondary | ICD-10-CM

## 2022-03-29 DIAGNOSIS — K209 Esophagitis, unspecified without bleeding: Secondary | ICD-10-CM | POA: Diagnosis not present

## 2022-03-29 DIAGNOSIS — K2100 Gastro-esophageal reflux disease with esophagitis, without bleeding: Secondary | ICD-10-CM

## 2022-03-29 DIAGNOSIS — K3189 Other diseases of stomach and duodenum: Secondary | ICD-10-CM | POA: Diagnosis not present

## 2022-03-29 DIAGNOSIS — R1013 Epigastric pain: Secondary | ICD-10-CM | POA: Diagnosis not present

## 2022-03-29 MED ORDER — SODIUM CHLORIDE 0.9 % IV SOLN: 500.0000 mL | Freq: Once | INTRAVENOUS | Status: DC

## 2022-03-29 NOTE — Progress Notes (Unsigned)
Called to room to assist during endoscopic procedure.  Patient ID and intended procedure confirmed with present staff. Received instructions for my participation in the procedure from the performing physician.  

## 2022-03-29 NOTE — Progress Notes (Unsigned)
Pt's states no medical or surgical changes since previsit or office visit. 

## 2022-03-29 NOTE — Patient Instructions (Signed)
Please continue Protonix 20mg  daily as needed.  Handout on GERD/hiatal hernia provided.  YOU HAD AN ENDOSCOPIC PROCEDURE TODAY AT [facility]:   Refer to the procedure report that was given to you for any specific questions about what was found during the examination.  If the procedure report does not answer your questions, please call your gastroenterologist to clarify.  If you requested that your care partner not be given the details of your procedure findings, then the procedure report has been included in a sealed envelope for you to review at your convenience later.  YOU SHOULD EXPECT: Some feelings of bloating in the abdomen. Passage of more gas than usual.  Walking can help get rid of the air that was put into your GI tract during the procedure and reduce the bloating. If you had a lower endoscopy (such as a colonoscopy or flexible sigmoidoscopy) you may notice spotting of blood in your stool or on the toilet paper. If you underwent a bowel prep for your procedure, you may not have a normal bowel movement for a few days.  Please Note:  You might notice some irritation and congestion in your nose or some drainage.  This is from the oxygen used during your procedure.  There is no need for concern and it should clear up in a day or so.  SYMPTOMS TO REPORT IMMEDIATELY:  Following upper endoscopy (EGD)  Vomiting of blood or coffee ground material  New chest pain or pain under the shoulder blades  Painful or persistently difficult swallowing  New shortness of breath  Fever of 100F or higher  Black, tarry-looking stools  For urgent or emergent issues, a gastroenterologist can be reached at any hour by calling [phone]. Do not use MyChart messaging for urgent concerns.    DIET:  We do recommend a small meal at first, but then you may proceed to your regular diet.  Drink plenty of fluids but you should avoid alcoholic beverages for 24 hours.  ACTIVITY:  You should plan  to take it easy for the rest of today and you should NOT DRIVE or use heavy machinery until tomorrow (because of the sedation medicines used during the test).    FOLLOW UP: Our staff will call the number listed on your records the next business day following your procedure.  We will call around 7:15- 8:00 am to check on you and address any questions or concerns that you may have regarding the information given to you following your procedure. If we do not reach you, we will leave a message.     If any biopsies were taken you will be contacted by phone or by letter within the next 1-3 weeks.  Please call us at [phone] if you have not heard about the biopsies in 3 weeks.    SIGNATURES/CONFIDENTIALITY: You and/or your care partner have signed paperwork which will be entered into your electronic medical record.  These signatures attest to the fact that that the information above on your After Visit Summary has been reviewed and is understood.  Full responsibility of the confidentiality of this discharge information lies with you and/or your care-partner.

## 2022-03-29 NOTE — Progress Notes (Unsigned)
Please refer to office visit note 03/28/22. No additional changes in H&P Patient is appropriate for planned procedure(s) and anesthesia in an ambulatory setting  K. Veena Johnwesley Lederman , MD [phone]   

## 2022-03-29 NOTE — Op Note (Signed)
[facility] Patient Name: Alison Ballard Procedure Date: 03/29/2022 12:41 PM MRN: [id number] Endoscopist: Mauri Pole , MD Age: 21 Referring MD:  Date of Birth: 09-07-2000 Gender: Female Account #: [id number] Procedure:                Upper GI endoscopy Indications:              Epigastric abdominal pain, Esophageal reflux                            symptoms that persist despite appropriate therapy,                            Esophageal reflux symptoms that recur despite                            appropriate therapy Medicines:                Monitored Anesthesia Care Procedure:                Pre-Anesthesia Assessment:                           - Prior to the procedure, a History and Physical                            was performed, and patient medications and                            allergies were reviewed. The patient's tolerance of                            previous anesthesia was also reviewed. The risks                            and benefits of the procedure and the sedation                            options and risks were discussed with the patient.                            All questions were answered, and informed consent                            was obtained. Prior Anticoagulants: The patient has                            taken no previous anticoagulant or antiplatelet                            agents. ASA Grade Assessment: II - A patient with                            mild systemic disease. After reviewing the risks  and benefits, the patient was deemed in                            satisfactory condition to undergo the procedure.                           After obtaining informed consent, the endoscope was                            passed under direct vision. Throughout the                            procedure, the patient's blood pressure, pulse, and                            oxygen saturations were monitored  continuously. The                            GIF [id number] was introduced through the                            mouth, and advanced to the second part of duodenum.                            The upper GI endoscopy was accomplished without                            difficulty. The patient tolerated the procedure                            well. Scope In: Scope Out: Findings:                 LA Grade A (one or more mucosal breaks less than 5                            mm, not extending between tops of 2 mucosal folds)                            esophagitis with no bleeding was found 34 to 35 cm                            from the incisors.                           The exam of the esophagus was otherwise normal.                           A 2 cm hiatal hernia was present.                           The gastroesophageal flap valve was visualized  endoscopically and classified as Hill Grade II                            (fold present, opens with respiration).                           The stomach was normal.                           The cardia and gastric fundus were normal on                            retroflexion.                           The first portion of the duodenum and second                            portion of the duodenum were normal. Biopsies for                            histology were taken with a cold forceps for                            evaluation of celiac disease. Complications:            No immediate complications. Estimated Blood Loss:     Estimated blood loss was minimal. Impression:               - LA Grade A reflux esophagitis with no bleeding.                           - 2 cm hiatal hernia.                           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                           - Normal stomach.                           - Normal first  portion of the duodenum and second                            portion of the duodenum. Biopsied. Recommendation:           - Patient has a contact number available for                            emergencies. The signs and symptoms of potential                            delayed complications were discussed with the  patient. Return to normal activities tomorrow.                            Written discharge instructions were provided to the                            patient.                           - Resume previous diet.                           - Continue present medications.                           - Await pathology results.                           - Follow an antireflux regimen.                           - Use Protonix (pantoprazole) 20 mg PO daily PRN. Mauri Pole, MD 03/29/2022 1:50:26 PM This report has been signed electronically.

## 2022-03-29 NOTE — Progress Notes (Unsigned)
Sedate, gd SR, tolerated procedure well, VSS, report to RN 

## 2022-03-30 ENCOUNTER — Telehealth: Payer: Self-pay | Admitting: *Deleted

## 2022-03-30 NOTE — Telephone Encounter (Signed)
  Follow up Call-     03/29/2022   12:50 PM  Call back number  Post procedure Call Back phone  # [phone]  Permission to leave phone message Yes     Patient questions:  Do you have a fever, pain , or abdominal swelling? No. Pain Score  0 *  Have you tolerated food without any problems? Yes.    Have you been able to return to your normal activities? Yes.    Do you have any questions about your discharge instructions: Diet   No. Medications  No. Follow up visit  No.  Do you have questions or concerns about your Care? No.  Actions: * If pain score is 4 or above: No action needed, pain <4.

## 2022-04-07 ENCOUNTER — Encounter: Payer: BC Managed Care – PPO | Admitting: Gastroenterology

## 2022-04-13 ENCOUNTER — Encounter: Payer: Self-pay | Admitting: Gastroenterology

## 2022-04-17 DIAGNOSIS — Z6821 Body mass index (BMI) 21.0-21.9, adult: Secondary | ICD-10-CM | POA: Diagnosis not present

## 2022-04-17 DIAGNOSIS — Z01419 Encounter for gynecological examination (general) (routine) without abnormal findings: Secondary | ICD-10-CM | POA: Diagnosis not present

## 2022-04-19 ENCOUNTER — Encounter: Payer: Self-pay | Admitting: Neurology

## 2022-04-19 ENCOUNTER — Ambulatory Visit (INDEPENDENT_AMBULATORY_CARE_PROVIDER_SITE_OTHER): Payer: BC Managed Care – PPO | Admitting: Neurology

## 2022-04-19 DIAGNOSIS — G43009 Migraine without aura, not intractable, without status migrainosus: Secondary | ICD-10-CM

## 2022-04-19 MED ORDER — UBRELVY 100 MG PO TABS: 100.0000 mg | ORAL_TABLET | ORAL | 11 refills | Status: DC | PRN

## 2022-04-19 MED ORDER — PROPRANOLOL HCL ER 60 MG PO CP24: 60.0000 mg | ORAL_CAPSULE | Freq: Every day | ORAL | 4 refills | Status: AC

## 2022-04-19 MED ORDER — QULIPTA 60 MG PO TABS: 60.0000 mg | ORAL_TABLET | Freq: Every day | ORAL | 11 refills | Status: DC

## 2022-04-19 NOTE — Progress Notes (Signed)
[facility]    Provider:  Dr Lucia Gaskins Requesting Provider: Mayer Masker, PA-C Primary Care Provider:  Mayer Masker, PA-C  CC:  Migraines  04/19/2022: doing incredibly well. Alison Ballard is working great. She has small headaches/migraines 4 a month max and doing fantastic. Med refill.     Tried: propranolol, rizatriptan(not effective), sumatriptan(side effects), nortriptyline(side effects), topiramate is contraindicated due to kidney stones. Her BP is much better now. Cannot increase propranolol due to hypotension, ajovy, ubrelvy, nurtec, qulipta, propranolol, rizatriptan, sumatriptan, nortriptyline, topiramate is contraindicated due to kidney stones.   Patient complains of symptoms per HPI as well as the following symptoms: none . Pertinent negatives and positives per HPI. All others negative   11/16/2021: Migraines are improved from 15 to 4 a month but still headache but even the headaches are improved in severity. No dizziness. Rizatriptan did not help. Episodic migraines. 4 migraines a month and < 10 total ha days/mo. Tried: propranolol, rizatriptan, sumatriptan, nortriptyline, topiramate is contraindicated due to kidney stones. Her BP is much better now. Cannot increase propranolo due to hypotension. Start atogepant, try [place].  Patient complains of symptoms per HPI as well as the following symptoms: headaches . Pertinent negatives and positives per HPI. All others negative   HPI:  Alison Ballard is a 21 y.o. female here as requested by Mayer Masker, PA-C for migraines.  Patient has a past medical history of polycystic kidney disease, headache, blood pressure elevation, chronic kidney disease, bilateral nephrolithiasis,, hypertension, chronic kidney disease, she was referred by Vallery Sa at [facility], they ordered a renal ultrasound and MRI of the head, she has seen gastroenterology and they have help with constipation, she is working on fluid  intake coat, overall pain is better sometimes doing activity not an issue since early September on a trip.  She was recently diagnosed with PKD and the pain they are referring to his flank pain and fullness of the flank and early satiety.  They have also ordered CT of the abdomen and MRI of the brain with and without contrast.  She states she has headaches.  Imaging could not rule out aneurysm per notes from [facility].  She has had headaches since the age of 56. Starts in the occipital area and stiffness and then radiates to the head, pulsating/pounding/throbbing, light and sound sensitivity, moderate to severe in pain, nausea and vomiting, can last 24 hours. They have been getting worse over the last few years, took too much ibuprofen because it seemed to helps and hurt her stomach, on a muscle relaxant helps a little, she has at least 15 headache days a month, 8 migrainous mod to severe ongoing for at least 6  months, no aura. Unknown triggers, a lot of times wakes up with them but no snoring, no excessive daytime fatigue, no numbness or tingling.  A lot of neck tightness. Has been just lving with it. No aura. No other focal neurologic deficits, associated symptoms, inciting events or modifiable factors.  Reviewed notes, labs and imaging from outside physicians, which showed: personally reviewed images and agree(additional 15 minutes in addition):   September 2022 MRA of the head normal no aneurysm or other vascular abnormality identified  January 31, 2021 MRI with and without contrast showed no evidence of acute intracranial abnormality, small nonenhancing discrete T2 hyperintensities in the right periatrial and left parietal white matter suppressed with FLAIR imaging and may represent benign dilated perivascular spaces, benign cysts, or less likely the sequelae of prior insult.  He does note an aneurysm and is not excluded on this study. (I reviewed images with patient and mother as well,  feel findings above are benign)  01/31/2021: CT abdomen pelvis with and without contrast findings most consistent with polycystic kidney liver disease, bilateral nephrolithiasis without obstructive uropathy, constipation, left ovarian corpus luteal cyst.   Review of Systems: Patient complains of symptoms per HPI as well as the following symptoms headaches. Pertinent negatives and positives per HPI. All others negative.   Social History   Socioeconomic History   Marital status: Single    Spouse name: Not on file   Number of children: 0   Years of education: Not on file   Highest education level: Not on file  Occupational History   Occupation: Ship broker, receptionist  Tobacco Use   Smoking status: Never   Smokeless tobacco: Never  Vaping Use   Vaping Use: Never used  Substance and Sexual Activity   Alcohol use: Never   Drug use: Never   Sexual activity: Not Currently  Other Topics Concern   Not on file  Social History Narrative   Caffeine 1 dink daily.  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   Social Determinants of Radio broadcast assistant Strain: Not on file  Food Insecurity: Not on file  Transportation Needs: Not on file  Physical Activity: Not on file  Stress: Not on file  Social Connections: Not on file  Intimate Partner Violence: Not on file    Family History  Problem Relation Age of Onset   Hypertension Mother    Healthy Mother    Hyperlipidemia Father    Healthy Father    Ovarian cysts Paternal Aunt    Migraines Neg Hx    Esophageal cancer Neg Hx    Rectal cancer Neg Hx    Colon cancer Neg Hx    Stomach cancer Neg Hx     Past Medical History:  Diagnosis Date   Brain cyst    Kidney stones    Ovarian cyst    Polycystic kidney disease    Polycystic liver disease     Patient Active Problem List   Diagnosis Date Noted   Migraine without aura and without status migrainosus, not intractable 09/01/2021   Constipation 02/02/2021   Nausea with  vomiting 02/02/2021   Polycystic kidney disease 01/18/2021    Past Surgical History:  Procedure Laterality Date   TONSILLECTOMY AND ADENOIDECTOMY      Current Outpatient Medications  Medication Sig Dispense Refill   dicyclomine (BENTYL) 10 MG capsule Take 1 capsule (10 mg total) by mouth every 8 (eight) hours as needed for spasms. 90 capsule 3   [name] tablet Take 1 tablet by mouth daily.     pantoprazole (PROTONIX) 20 MG tablet Take 1 tablet (20 mg total) by mouth daily. 90 tablet 3   Atogepant (QULIPTA) 60 MG TABS Take 60 mg by mouth daily. 30 tablet 11   propranolol ER (INDERAL LA) 60 MG 24 hr capsule Take 1 capsule (60 mg total) by mouth at bedtime. 90 capsule 4   Ubrogepant (UBRELVY) 100 MG TABS Take 100 mg by mouth every 2 (two) hours as needed. Maximum 200mg  a day. 16 tablet 11   No current facility-administered medications for this visit.    Allergies as of 04/19/2022   (No Known Allergies)    Vitals: BP 112/69   Pulse (!) 59   Ht 5\' 5"  (1.651 m)   Wt 127 lb  3.2 oz (57.7 kg)   BMI 21.17 kg/m  Last Weight:  Wt Readings from Last 1 Encounters:  04/19/22 127 lb 3.2 oz (57.7 kg)   Last Height:   Ht Readings from Last 1 Encounters:  04/19/22 5\' 5"  (1.651 m)   Exam: NAD, pleasant                  Speech:    Speech is normal; fluent and spontaneous with normal comprehension.  Cognition:    The patient is oriented to person, place, and time;     recent and remote memory intact;     language fluent;    Cranial Nerves:    The pupils are equal, round, and reactive to light.Trigeminal sensation is intact and the muscles of mastication are normal. The face is symmetric. The palate elevates in the midline. Hearing intact. Voice is normal. Shoulder shrug is normal. The tongue has normal motion without fasciculations.   Coordination:  No dysmetria  Motor Observation:    No asymmetry, no atrophy, and no involuntary movements noted. Tone:    Normal  muscle tone.     Strength:    Strength is V/V in the upper and lower limbs.      Sensation: intact to LT    Assessment/Plan:  Patient with episodic migraines, we spent quite a long time talking about migraines, migraine management, lifestyle factors, triggers, she is new to migraine management.  We talked about triptans, other acute and preventative medications, the new CGRP and other classes of medications.  Mother accompanies and also provides information.  doing incredibly well. is working great. She has small headaches/migraines 4 a month max and doing fantastic. Med refill.  Episodic migraines. 4 migraines a month and < 10 total ha days/mo.   Tried: propranolol, rizatriptan(not effective), sumatriptan(side effects), nortriptyline(side effects), topiramate is contraindicated due to kidney stones. Her BP is much better now. Cannot increase propranolol due to hypotension, ajovy, ubrelvy, nurtec, qulipta, propranolol, rizatriptan, sumatriptan, nortriptyline, topiramate is contraindicated due to kidney stones.   - Start atogepant, try Alison Ballard.   Meds ordered this encounter  Medications   propranolol ER (INDERAL LA) 60 MG 24 hr capsule    Sig: Take 1 capsule (60 mg total) by mouth at bedtime.    Dispense:  90 capsule    Refill:  4   Atogepant (QULIPTA) 60 MG TABS    Sig: Take 60 mg by mouth daily.    Dispense:  30 tablet    Refill:  11    Episodic migraines. 4 migraines a month and < 10 total ha days/mo. Tried: propranolol, rizatriptan, sumatriptan, nortriptyline, topiramate is contraindicated due to kidney stones.   Ubrogepant (UBRELVY) 100 MG TABS    Sig: Take 100 mg by mouth every 2 (two) hours as needed. Maximum 200mg  a day.    Dispense:  16 tablet    Refill:  11    Episodic migraines. 4 migraines a month and < 10 total ha days/mo. Tried: propranolol, rizatriptan, sumatriptan, nortriptyline, topiramate is contraindicated due to kidney stones.     Discussed: To prevent  or relieve headaches, try the following: Cool Compress. Lie down and place a cool compress on your head.  Avoid headache triggers. If certain foods or odors seem to have triggered your migraines in the past, avoid them. A headache diary might help you identify triggers.  Include physical activity in your daily routine. Try a daily walk or other moderate aerobic exercise.  Manage stress. Find  healthy ways to cope with the stressors, such as delegating tasks on your to-do list.  Practice relaxation techniques. Try deep breathing, yoga, massage and visualization.  Eat regularly. Eating regularly scheduled meals and maintaining a healthy diet might help prevent headaches. Also, drink plenty of fluids.  Follow a regular sleep schedule. Sleep deprivation might contribute to headaches Consider biofeedback. With this mind-body technique, you learn to control certain bodily functions -- such as muscle tension, heart rate and blood pressure -- to prevent headaches or reduce headache pain.    Proceed to emergency room if you experience new or worsening symptoms or symptoms do not resolve, if you have new neurologic symptoms or if headache is severe, or for any concerning symptom.   Provided education and documentation from American headache Society toolbox including articles on: chronic migraine medication overuse headache, chronic migraines, prevention of migraines, behavioral and other nonpharmacologic treatments for headache.     Cc: Blenda Mounts, [place]  Naomie Dean, MD  [facility] [address] [place], [place] [address]  Phone [phone] Fax [phone]  I spent over 15 minutes of face-to-face and non-face-to-face time with patient on the  1. Migraine without aura and without status migrainosus, not intractable      diagnosis.  This included previsit chart review, lab review, study review, order entry, electronic health record  documentation, patient education on the different diagnostic and therapeutic options, counseling and coordination of care, risks and benefits of management, compliance, or risk factor reduction

## 2022-05-11 ENCOUNTER — Encounter: Payer: Self-pay | Admitting: Physician Assistant

## 2022-05-11 ENCOUNTER — Ambulatory Visit (INDEPENDENT_AMBULATORY_CARE_PROVIDER_SITE_OTHER): Payer: BC Managed Care – PPO | Admitting: Physician Assistant

## 2022-05-11 VITALS — BP 112/77 | HR 74 | Temp 98.6°F | Resp 18 | Ht 65.0 in | Wt 127.8 lb

## 2022-05-11 DIAGNOSIS — B9689 Other specified bacterial agents as the cause of diseases classified elsewhere: Secondary | ICD-10-CM

## 2022-05-11 DIAGNOSIS — J208 Acute bronchitis due to other specified organisms: Secondary | ICD-10-CM

## 2022-05-11 MED ORDER — BENZONATATE 100 MG PO CAPS: 100.0000 mg | ORAL_CAPSULE | Freq: Two times a day (BID) | ORAL | 0 refills | Status: DC | PRN

## 2022-05-11 MED ORDER — AZITHROMYCIN 250 MG PO TABS: ORAL_TABLET | ORAL | 0 refills | Status: AC

## 2022-05-11 NOTE — Progress Notes (Signed)
Established patient acute visit   Patient: Alison Ballard   DOB: 2001/05/21   21 y.o. Female  MRN: [id number] Visit Date: 05/11/2022  Chief Complaint  Patient presents with   URI    Productive cough, green mucus   Subjective    HPI HPI     URI    Additional comments: Productive cough, green mucus      Last edited by Gemma Payor, CMA on 05/11/2022  3:02 PM.      Patient presents with c/o cough. Patient reports went to [place] last week and came back this past weekend. States Saturday woke up with a scratchy throat and cough. Cough is mostly productive with green sputum, worse at night and when first waking up in the am. Feeling worse the past two days. No fever, chills, night sweats, sinus pressure, or headache. Does have runny nose. Does report her chest hurts when coughing. Feeling tired. Has tried Mucinex and natural cough syrup with minimal relief. No one else is sick and denies sick contact exposures.   Medications: Outpatient Medications Prior to Visit  Medication Sig   Atogepant (QULIPTA) 60 MG TABS Take 60 mg by mouth daily.   [name] tablet Take 1 tablet by mouth daily.   propranolol ER (INDERAL LA) 60 MG 24 hr capsule Take 1 capsule (60 mg total) by mouth at bedtime.   Ubrogepant (UBRELVY) 100 MG TABS Take 100 mg by mouth every 2 (two) hours as needed. Maximum 200mg  a day.   dicyclomine (BENTYL) 10 MG capsule Take 1 capsule (10 mg total) by mouth every 8 (eight) hours as needed for spasms. (Patient not taking: Reported on 05/11/2022)   pantoprazole (PROTONIX) 20 MG tablet Take 1 tablet (20 mg total) by mouth daily. (Patient not taking: Reported on 05/11/2022)   No facility-administered medications prior to visit.    Review of Systems Review of Systems:  A fourteen system review of systems was performed and found to be positive as per HPI.     Objective    BP 112/77 (BP Location: Right Arm, Patient Position: Sitting, Cuff Size: Normal)    Pulse 74   Temp 98.6 F (37 C) (Oral)   Resp 18   Ht 5\' 5"  (1.651 m)   Wt 127 lb 12.8 oz (58 kg)   SpO2 96%   BMI 21.27 kg/m    Physical Exam  General:  Well Developed, well nourished, appropriate for stated age.  Neuro:  Alert and oriented,  extra-ocular muscles intact  HEENT:  Normocephalic, atraumatic, PERRL, no sinus tenderness, normal TM's of both ears, normal external ear canals, pale nasal mucosa, normal posterior oropharynx, neck supple, +anterior cervical adenopathy  Skin:  no gross rash, warm, pink. Cardiac:  RRR, S1 S2 Respiratory: +rhonchi, no wheezing or crackles. Vascular:  Ext warm, no cyanosis apprec.; cap RF less 2 sec. Psych:  No HI/SI, judgement and insight good, Euthymic mood. Full Affect.   No results found for any visits on 05/11/22.  Assessment & Plan     Discussed with patient has s/sx suggestive of bacterial bronchitis and due to acute worsening symptoms will start oral antibiotic therapy with Zpack. Recommend to take tessalon perles 100 mg three times daily as needed for cough. Discussed if symptoms fail to improve or worsen then also recommend starting oral corticosteroid therapy and to let me know, pt verbalized understanding.   Return if symptoms worsen or fail to improve.  Lorrene Reid, PA-C  [facility] [phone] (phone) [phone] (fax)  [place]

## 2022-05-11 NOTE — Patient Instructions (Signed)
  Acute Bronchitis, Adult  Acute bronchitis is when air tubes in the lungs (bronchi) suddenly get swollen. The condition can make it hard for you to breathe. In adults, acute bronchitis usually goes away within 2 weeks. A cough caused by bronchitis may last up to 3 weeks. Smoking, allergies, and asthma can make the condition worse. What are the causes? Germs that cause cold and flu (viruses). The most common cause of this condition is the virus that causes the common cold. Bacteria. Substances that bother (irritate) the lungs, including: Smoke from cigarettes and other types of tobacco. Dust and pollen. Fumes from chemicals, gases, or burned fuel. Indoor or outdoor air pollution. What increases the risk? A weak body's defense system. This is also called the immune system. Any condition that affects your lungs and breathing, such as asthma. What are the signs or symptoms? A cough. Coughing up clear, yellow, or green mucus. Making high-pitched whistling sounds when you breathe, most often when you breathe out (wheezing). Runny or stuffy nose. Having too much mucus in your lungs (chest congestion). Shortness of breath. Body aches. A sore throat. How is this treated? Acute bronchitis may go away over time without treatment. Your doctor may tell you to: Drink more fluids. This will help thin your mucus so it is easier to cough up. Use a device that gets medicine into your lungs (inhaler). Use a vaporizer or a humidifier. These are machines that add water to the air. This helps with coughing and poor breathing. Take a medicine that thins mucus and helps clear it from your lungs. Take a medicine that prevents or stops coughing. It is not common to take an antibiotic medicine for this condition. Follow these instructions at home:  Take over-the-counter and prescription medicines only as told by your doctor. Use an inhaler, vaporizer, or humidifier as told by your doctor. Take two  teaspoons (10 mL) of honey at bedtime. This helps lessen your coughing at night. Drink enough fluid to keep your pee (urine) pale yellow. Do not smoke or use any products that contain nicotine or tobacco. If you need help quitting, ask your doctor. Get a lot of rest. Return to your normal activities when your doctor says that it is safe. Keep all follow-up visits. How is this prevented?  Wash your hands often with soap and water for at least 20 seconds. If you cannot use soap and water, use hand sanitizer. Avoid contact with people who have cold symptoms. Try not to touch your mouth, nose, or eyes with your hands. Avoid breathing in smoke or chemical fumes. Make sure to get the flu shot every year. Contact a doctor if: Your symptoms do not get better in 2 weeks. You have trouble coughing up the mucus. Your cough keeps you awake at night. You have a fever. Get help right away if: You cough up blood. You have chest pain. You have very bad shortness of breath. You faint or keep feeling like you are going to faint. You have a very bad headache. Your fever or chills get worse. These symptoms may be an emergency. Get help right away. Call your local emergency services (911 in the [place] Do not wait to see if the symptoms will go away. Do not drive yourself to the hospital. Summary Acute bronchitis is when air tubes in the lungs (bronchi) suddenly get swollen. In adults, acute bronchitis usually goes away within 2 weeks. Drink more fluids. This will help thin your mucus so it   is easier to cough up. Take over-the-counter and prescription medicines only as told by your doctor. Contact a doctor if your symptoms do not improve after 2 weeks of treatment. This information is not intended to replace advice given to you by your health care provider. Make sure you discuss any questions you have with your health care provider. Document Revised: 10/27/2020 Document Reviewed: 10/27/2020 Elsevier  Patient Education  2023 [employer]  

## 2022-05-15 ENCOUNTER — Encounter: Payer: Self-pay | Admitting: Physician Assistant

## 2022-05-16 ENCOUNTER — Other Ambulatory Visit: Payer: Self-pay | Admitting: Physician Assistant

## 2022-05-16 DIAGNOSIS — B9689 Other specified bacterial agents as the cause of diseases classified elsewhere: Secondary | ICD-10-CM

## 2022-05-16 MED ORDER — PREDNISONE 20 MG PO TABS: ORAL_TABLET | ORAL | 0 refills | Status: DC

## 2022-06-12 DIAGNOSIS — Q613 Polycystic kidney, unspecified: Secondary | ICD-10-CM | POA: Diagnosis not present

## 2022-06-14 DIAGNOSIS — N181 Chronic kidney disease, stage 1: Secondary | ICD-10-CM | POA: Diagnosis not present

## 2022-06-14 DIAGNOSIS — N200 Calculus of kidney: Secondary | ICD-10-CM | POA: Diagnosis not present

## 2022-06-14 DIAGNOSIS — Q613 Polycystic kidney, unspecified: Secondary | ICD-10-CM | POA: Diagnosis not present

## 2022-06-14 DIAGNOSIS — R030 Elevated blood-pressure reading, without diagnosis of hypertension: Secondary | ICD-10-CM | POA: Diagnosis not present

## 2022-06-16 ENCOUNTER — Other Ambulatory Visit: Payer: Self-pay | Admitting: Nephrology

## 2022-06-16 DIAGNOSIS — Q613 Polycystic kidney, unspecified: Secondary | ICD-10-CM

## 2022-06-16 DIAGNOSIS — N200 Calculus of kidney: Secondary | ICD-10-CM

## 2022-06-16 DIAGNOSIS — R519 Headache, unspecified: Secondary | ICD-10-CM

## 2022-06-16 DIAGNOSIS — N181 Chronic kidney disease, stage 1: Secondary | ICD-10-CM

## 2022-06-16 DIAGNOSIS — R030 Elevated blood-pressure reading, without diagnosis of hypertension: Secondary | ICD-10-CM

## 2022-06-16 DIAGNOSIS — K219 Gastro-esophageal reflux disease without esophagitis: Secondary | ICD-10-CM

## 2022-06-24 ENCOUNTER — Encounter: Payer: Self-pay | Admitting: Nurse Practitioner

## 2022-06-27 ENCOUNTER — Other Ambulatory Visit: Payer: Self-pay | Admitting: Nurse Practitioner

## 2022-06-27 DIAGNOSIS — B9689 Other specified bacterial agents as the cause of diseases classified elsewhere: Secondary | ICD-10-CM

## 2022-06-27 MED ORDER — CEFUROXIME AXETIL 500 MG PO TABS: 500.0000 mg | ORAL_TABLET | Freq: Two times a day (BID) | ORAL | 0 refills | Status: DC

## 2022-06-27 MED ORDER — METHYLPREDNISOLONE 4 MG PO TBPK: ORAL_TABLET | ORAL | 0 refills | Status: DC

## 2022-06-29 ENCOUNTER — Ambulatory Visit
Admission: RE | Admit: 2022-06-29 | Discharge: 2022-06-29 | Disposition: A | Discharge status: Home / Self Care | Payer: BC Managed Care – PPO | Source: Ambulatory Visit | Attending: Nephrology | Admitting: Nephrology

## 2022-06-29 DIAGNOSIS — K219 Gastro-esophageal reflux disease without esophagitis: Secondary | ICD-10-CM

## 2022-06-29 DIAGNOSIS — R519 Headache, unspecified: Secondary | ICD-10-CM

## 2022-06-29 DIAGNOSIS — R030 Elevated blood-pressure reading, without diagnosis of hypertension: Secondary | ICD-10-CM

## 2022-06-29 DIAGNOSIS — N200 Calculus of kidney: Secondary | ICD-10-CM

## 2022-06-29 DIAGNOSIS — N181 Chronic kidney disease, stage 1: Secondary | ICD-10-CM

## 2022-06-29 DIAGNOSIS — Q613 Polycystic kidney, unspecified: Secondary | ICD-10-CM

## 2022-06-30 ENCOUNTER — Other Ambulatory Visit: Payer: Self-pay

## 2022-06-30 ENCOUNTER — Emergency Department (HOSPITAL_COMMUNITY)
Admission: EM | Admit: 2022-06-30 | Discharge: 2022-06-30 | Disposition: A | Discharge status: Home / Self Care | Payer: BC Managed Care – PPO | Attending: Emergency Medicine | Admitting: Emergency Medicine

## 2022-06-30 ENCOUNTER — Emergency Department (HOSPITAL_COMMUNITY): Payer: BC Managed Care – PPO

## 2022-06-30 DIAGNOSIS — R109 Unspecified abdominal pain: Secondary | ICD-10-CM

## 2022-06-30 DIAGNOSIS — N200 Calculus of kidney: Secondary | ICD-10-CM | POA: Insufficient documentation

## 2022-06-30 DIAGNOSIS — K7689 Other specified diseases of liver: Secondary | ICD-10-CM | POA: Diagnosis not present

## 2022-06-30 DIAGNOSIS — N3289 Other specified disorders of bladder: Secondary | ICD-10-CM | POA: Diagnosis not present

## 2022-06-30 LAB — URINALYSIS, ROUTINE W REFLEX MICROSCOPIC
Bacteria, UA: NONE SEEN
Bilirubin Urine: NEGATIVE
Glucose, UA: NEGATIVE mg/dL
Ketones, ur: NEGATIVE mg/dL
Leukocytes,Ua: NEGATIVE
Nitrite: NEGATIVE
Protein, ur: 30 mg/dL — AB
RBC / HPF: >50 RBC/hpf — ABNORMAL HIGH (ref 0–5)
Specific Gravity, Urine: 1.028 (ref 1.005–1.030)
pH: 5 (ref 5.0–8.0)

## 2022-06-30 LAB — PREGNANCY, URINE: Preg Test, Ur: NEGATIVE

## 2022-06-30 MED ORDER — ONDANSETRON 4 MG PO TBDP: 4.0000 mg | ORAL_TABLET | Freq: Three times a day (TID) | ORAL | 0 refills | Status: DC | PRN

## 2022-06-30 MED ORDER — OXYCODONE HCL 5 MG PO TABS
5.0000 mg | ORAL_TABLET | Freq: Once | ORAL | Status: AC
  Administered 2022-06-30: 5 mg via ORAL
  Filled 2022-06-30: qty 1

## 2022-06-30 MED ORDER — OXYCODONE HCL 5 MG PO TABS: 5.0000 mg | ORAL_TABLET | ORAL | 0 refills | Status: DC | PRN

## 2022-06-30 NOTE — Discharge Instructions (Addendum)
You can take Roxicodone as needed for pain. I recommend close follow-up with PCP or urology for reevaluation.  Please do not hesitate to return to emergency department if worrisome signs symptoms we discussed become apparent.

## 2022-06-30 NOTE — ED Provider Notes (Cosign Needed Addendum)
[facility]-EMERGENCY DEPT Provider Note   CSN: [id number] Arrival date & time: 06/30/22  1524     History  Chief Complaint  Patient presents with   Flank Pain    Alison Ballard is a 21 y.o. female with a past medical history of kidney stones, ovarian cyst, polycystic kidney disease, polycystic liver disease presents to the emergency department for evaluation of right flank pain. Pt reports right-sided flank pain that started yesterday.  Patient states the pain she had today is similar to the pain caused by kidney stones she had over the summer.  This time the pain is located only on the right flank, sharp, intermittent, radiating to her lower belly and lower back.  Patient has not tried anything for pain at home.  She denies seeing blood in her urine.  No increased urinary urgency or frequency.  Endorses nausea without vomiting.  No fever, chest pain, shortness of breath, bowel changes.    Flank Pain    Past Medical History:  Diagnosis Date   Brain cyst    Kidney stones    Ovarian cyst    Polycystic kidney disease    Polycystic liver disease    Past Surgical History:  Procedure Laterality Date   TONSILLECTOMY AND ADENOIDECTOMY        Home Medications Prior to Admission medications   Medication Sig Start Date End Date Taking? Authorizing Provider  ondansetron (ZOFRAN-ODT) 4 MG disintegrating tablet Take 1 tablet (4 mg total) by mouth every 8 (eight) hours as needed for nausea or vomiting. 06/30/22  Yes Jeanelle Malling, PA  oxyCODONE (ROXICODONE) 5 MG immediate release tablet Take 1 tablet (5 mg total) by mouth every 4 (four) hours as needed for up to 8 doses for severe pain. 06/30/22  Yes Jeanelle Malling, PA  Atogepant (QULIPTA) 60 MG TABS Take 60 mg by mouth daily. 04/19/22   Anson Fret, MD  benzonatate (TESSALON) 100 MG capsule Take 1 capsule (100 mg total) by mouth 2 (two) times daily as needed for cough. 05/11/22   Mayer Masker, PA-C  cefUROXime (CEFTIN)  500 MG tablet Take 1 tablet (500 mg total) by mouth 2 (two) times daily with a meal. 06/27/22   Boscia, Kathlynn Grate, NP  dicyclomine (BENTYL) 10 MG capsule Take 1 capsule (10 mg total) by mouth every 8 (eight) hours as needed for spasms. Patient not taking: Reported on 05/11/2022 03/28/22   Napoleon Form, MD  [name] tablet Take 1 tablet by mouth daily. 06/22/21   [provider]  methylPREDNISolone (MEDROL) 4 MG TBPK tablet Take by mouth as directed for 6 days 06/27/22   Carlean Jews, NP  pantoprazole (PROTONIX) 20 MG tablet Take 1 tablet (20 mg total) by mouth daily. Patient not taking: Reported on 05/11/2022 03/28/22   Napoleon Form, MD  propranolol ER (INDERAL LA) 60 MG 24 hr capsule Take 1 capsule (60 mg total) by mouth at bedtime. 04/19/22   Anson Fret, MD  Ubrogepant (UBRELVY) 100 MG TABS Take 100 mg by mouth every 2 (two) hours as needed. Maximum 200mg  a day. 04/19/22   06/19/22, MD      Allergies    Patient has no known allergies.    Review of Systems   Review of Systems  Genitourinary:  Positive for flank pain.    Physical Exam Updated Vital Signs BP 121/76   Pulse (!) 59   Temp 98.6 F (37 C) (Oral)  Resp 14   LMP 06/23/2022   SpO2 98%  Physical Exam Vitals and nursing note reviewed.  Constitutional:      Appearance: Normal appearance.  HENT:     Head: Normocephalic and atraumatic.     Mouth/Throat:     Mouth: Mucous membranes are moist.  Eyes:     General: No scleral icterus. Cardiovascular:     Rate and Rhythm: Normal rate and regular rhythm.     Pulses: Normal pulses.     Heart sounds: Normal heart sounds.  Pulmonary:     Effort: Pulmonary effort is normal.     Breath sounds: Normal breath sounds.  Abdominal:     General: Abdomen is flat.     Palpations: Abdomen is soft.     Tenderness: There is no abdominal tenderness.  Musculoskeletal:        General: No deformity.     Comments: Tenderness to  palpation to right flank.  Negative CVA tenderness.  Skin:    General: Skin is warm.     Findings: No rash.  Neurological:     General: No focal deficit present.     Mental Status: She is alert.  Psychiatric:        Mood and Affect: Mood normal.     ED Results / Procedures / Treatments   Labs (all labs ordered are listed, but only abnormal results are displayed) Labs Reviewed  URINALYSIS, ROUTINE W REFLEX MICROSCOPIC - Abnormal; Notable for the following components:      Result Value   Hgb urine dipstick LARGE (*)    Protein, ur 30 (*)    RBC / HPF >50 (*)    All other components within normal limits  PREGNANCY, URINE    EKG None  Radiology CT Renal Stone Study  Result Date: 06/30/2022 CLINICAL DATA:  Right-sided flank pain EXAM: CT ABDOMEN AND PELVIS WITHOUT CONTRAST TECHNIQUE: Multidetector CT imaging of the abdomen and pelvis was performed following the standard protocol without IV contrast. RADIATION DOSE REDUCTION: This exam was performed according to the departmental dose-optimization program which includes automated exposure control, adjustment of the mA and/or kV according to patient size and/or use of iterative reconstruction technique. COMPARISON:  Ultrasound from the previous day. FINDINGS: Lower chest: No acute abnormality. Hepatobiliary: Liver demonstrates scattered hypodensities similar to that seen on the prior exam consistent with multiple simple cysts. Gallbladder is within normal limits. Pancreas: Unremarkable. No pancreatic ductal dilatation or surrounding inflammatory changes. Spleen: Normal in size without focal abnormality. Adrenals/Urinary Tract: Multiple cysts are noted within the kidneys bilaterally similar to that seen on recent ultrasound as well as the prior exam. Few small calculi are noted bilaterally measuring less than 3 mm. Some of the cysts are hyperdense in nature. This is stable from the prior CT from 2022. No obstructive changes are noted. The  bladder is partially distended. Stomach/Bowel: Mild retained fecal material is noted within the colon without obstructive change. No inflammatory changes are noted. The appendix is within normal limits in the midline of pelvis. Small bowel and stomach are unremarkable. Vascular/Lymphatic: No significant vascular findings are present. No enlarged abdominal or pelvic lymph nodes. Reproductive: Uterus and bilateral adnexa are unremarkable. Other: No abdominal wall hernia or abnormality. No abdominopelvic ascites. Musculoskeletal: No acute or significant osseous findings. IMPRESSION: Multiple simple and hyperdense cysts within the kidneys bilaterally. These are stable in appearance from prior CT as well as prior ultrasound examination from the previous day. No follow-up is recommended. Multiple associated hepatic  cysts also stable from prior CT. Multiple small renal calculi are noted bilaterally without obstructive change. No other focal abnormality is noted. Electronically Signed   By: Alcide Clever M.D.   On: 06/30/2022 18:13   US RENAL  Result Date: 06/29/2022 CLINICAL DATA:  Polycystic kidney disease EXAM: RENAL / URINARY TRACT ULTRASOUND COMPLETE COMPARISON:  Abdominal ultrasound January 31, 2022 FINDINGS: Right Kidney: Renal measurements: 12.1 x 3.3 x 4.2 cm = volume: 86 mL. Contains multiple cysts, less than 10. The largest cyst measures 2.4 cm. No follow-up imaging recommended for the cysts. Left Kidney: Renal measurements: 12 x 4.8 x 4.3 cm = volume: 130 mL. Contains multiple cysts, less than 10. The largest cyst measures 1.8 cm. No follow-up imaging recommended for the cysts. Bladder: Appears normal for degree of bladder distention. Other: None. IMPRESSION: Polycystic kidneys.  No follow-up imaging recommended for the cysts. Electronically Signed   By: Gerome Sam III M.D.   On: 06/29/2022 17:11    Procedures Procedures    Medications Ordered in ED Medications  oxyCODONE (Oxy IR/ROXICODONE)  immediate release tablet 5 mg (has no administration in time range)    ED Course/ Medical Decision Making/ A&P                           Medical Decision Making Amount and/or Complexity of Data Reviewed Labs: ordered. Radiology: ordered.  Risk Prescription drug management.   This patient presents to the ED for right-sided flank pain, this involves an extensive number of treatment options, and is a complaint that carries with a high risk of complications and morbidity.  The differential diagnosis includes, SBO, acute gastroenteritis, kidney stone, appendicitis, diverticulitis. This is not an exhaustive list.  Lab tests: I ordered and personally interpreted labs.  The pertinent results include: Urinalysis with positive Hgb, proteinuria and greater than 50 RBC.  Imaging studies: I ordered imaging studies. I personally reviewed, interpreted imaging and agree with the radiologist's interpretations. The results include:  Multiple simple and hyperdense cysts within the kidneys bilaterally. These are stable in appearance from prior CT as well as prior ultrasound examination from the previous day. No follow-up is recommended. Multiple associated hepatic cysts also stable from prior CT. Multiple small renal calculi are noted bilaterally without obstructive change. No other focal abnormality is noted.  Problem list/ ED course/ Critical interventions/ Medical management: HPI: See above Vital signs within normal range and stable throughout visit. Laboratory/imaging studies significant for: See above. On physical examination, patient is afebrile and appears in no acute distress. Patient presents with flank pain likely secondary to renal colic from likely non-obstructed non infected kidney stone. Given history, exam, and work up I have low suspicion for atypical appendicitis, acute cholecystitis, AAA, infected obstructed stone, pyelonephritis, or other emergent intraabdominal pathology. Symptoms and UA  indicate no infection. Pain treated in ED with Roxicodone. Patient appropriate for discharge with outpatient follow-up and Roxicodone for pain and Zofran for nausea.. Advised patient to follow-up with urology or primary care physician for further evaluation and management.  Return to the ER if new or worsening symptoms.. I have reviewed the patient home medicines and have made adjustments as needed.  Cardiac monitoring/EKG: The patient was maintained on a cardiac monitor.  I personally reviewed and interpreted the cardiac monitor which showed an underlying rhythm of: sinus rhythm.  Additional history obtained: External records from outside source obtained and reviewed including: Chart review including previous notes, labs, imaging.  Consultations  obtained:  Disposition Continued outpatient therapy. Follow-up with PCP/urology recommended for reevaluation of symptoms. Treatment plan discussed with patient.  Pt acknowledged understanding was agreeable to the plan. Worrisome signs and symptoms were discussed with patient, and patient acknowledged understanding to return to the ED if they noticed these signs and symptoms. Patient was stable upon discharge.   This chart was dictated using voice recognition software.  Despite best efforts to proofread,  errors can occur which can change the documentation meaning.          Final Clinical Impression(s) / ED Diagnoses Final diagnoses:  Right flank pain  Kidney stone    Rx / DC Orders ED Discharge Orders          Ordered    oxyCODONE (ROXICODONE) 5 MG immediate release tablet  Every 4 hours PRN        06/30/22 2021    ondansetron (ZOFRAN-ODT) 4 MG disintegrating tablet  Every 8 hours PRN        06/30/22 2021              Jeanelle MallingLe, Alexanderia Gorby, PA 06/30/22 2033    Jeanelle MallingLe, Dailan Pfalzgraf, PA 06/30/22 2035    Charlynne PanderYao, David Hsienta, MD 07/01/22 [phone]

## 2022-06-30 NOTE — ED Triage Notes (Addendum)
Patient c/o right side flank pain. Pt report pain started 2 hours ago. Patient report nausea. Denies vomiting. Pt report hx of kidney stones.

## 2022-06-30 NOTE — ED Provider Triage Note (Signed)
Emergency Medicine Provider Triage Evaluation Note  Alison Ballard , a 21 y.o. female  was evaluated in triage.  Pt complains of right-sided flank pain that started yesterday.  Patient states the pain she had today is similar to the pain caused by kidney stones she had over the summer.  This time the pain is located only on the right flank, sharp, intermittent, radiating to her lower belly and lower back.  Patient has not tried anything for pain at home.  She denies seeing blood in her urine.  No increased urinary urgency or frequency.  Endorses nausea without vomiting.  No fever, chest pain, shortness of breath, bowel changes.  Review of Systems  Positive: As above Negative: As above  Physical Exam  BP 112/69   Pulse 69   Temp 98.6 F (37 C) (Oral)   Resp 16   LMP 06/23/2022   SpO2 99%  Gen:   Awake, no distress   Resp:  Normal effort  MSK:   Moves extremities without difficulty  Other:  Tenderness to palpation to right flank.  Medical Decision Making  Medically screening exam initiated at 4:17 PM.  Appropriate orders placed.  BONNELL PLACZEK was informed that the remainder of the evaluation will be completed by another provider, this initial triage assessment does not replace that evaluation, and the importance of remaining in the ED until their evaluation is complete.     Jeanelle Malling, [place] 06/30/22 [phone]

## 2022-08-16 ENCOUNTER — Encounter: Payer: Self-pay | Admitting: Neurology

## 2022-08-30 ENCOUNTER — Telehealth: Payer: Self-pay | Admitting: *Deleted

## 2022-08-30 NOTE — Telephone Encounter (Addendum)
Approved today Request Reference Number: [id number]. QULIPTA TAB 60MG is approved through 08/31/2023. Your patient may now fill this prescription and it will be covered. Authorization Expiration Date: 08/31/2023  Approval letter faxed to [employer] [facility]. Received a receipt of confirmation.

## 2022-08-30 NOTE — Telephone Encounter (Signed)
Completed Qulipta PA on CMM. Key: [id number]. Awaiting determination from [facility].

## 2022-10-05 ENCOUNTER — Encounter: Payer: Self-pay | Admitting: *Deleted

## 2022-10-05 ENCOUNTER — Telehealth: Payer: Self-pay | Admitting: *Deleted

## 2022-10-05 NOTE — Telephone Encounter (Signed)
Completed Ubrelvy PA on CMM. Key: [id number]. Awaiting determination from [facility].

## 2022-10-09 NOTE — Telephone Encounter (Signed)
Provider Prior Authorization Notice  UBRELVY TAB 100MG , use as directed (16 tablets per month), is approved for 12 months through 10/05/2023. Reviewed by: Lestine Box.Ph. Please note: Doses/quantities above plan limits and/or maximum Food and Drug Administration (FDA) approved dosing may be subject to further review.

## 2022-10-24 ENCOUNTER — Encounter: Payer: Self-pay | Admitting: Gastroenterology

## 2022-10-26 NOTE — Telephone Encounter (Signed)
Miralax purge and Miralax 1/2 to 1 capful daily will likely help. Thanks

## 2022-11-08 ENCOUNTER — Telehealth: Payer: Self-pay | Admitting: Neurology

## 2022-11-08 NOTE — Telephone Encounter (Signed)
Sent mychart msg informing pt of appt change due to provider schedule change 

## 2022-12-09 DIAGNOSIS — J209 Acute bronchitis, unspecified: Secondary | ICD-10-CM | POA: Diagnosis not present

## 2022-12-09 DIAGNOSIS — R0981 Nasal congestion: Secondary | ICD-10-CM | POA: Diagnosis not present

## 2022-12-18 DIAGNOSIS — J209 Acute bronchitis, unspecified: Secondary | ICD-10-CM | POA: Diagnosis not present

## 2022-12-18 DIAGNOSIS — R0981 Nasal congestion: Secondary | ICD-10-CM | POA: Diagnosis not present

## 2022-12-18 DIAGNOSIS — R051 Acute cough: Secondary | ICD-10-CM | POA: Diagnosis not present

## 2023-04-23 ENCOUNTER — Telehealth: Payer: BC Managed Care – PPO | Admitting: Neurology

## 2023-04-24 ENCOUNTER — Telehealth (INDEPENDENT_AMBULATORY_CARE_PROVIDER_SITE_OTHER): Payer: BC Managed Care – PPO | Admitting: Neurology

## 2023-04-24 ENCOUNTER — Telehealth: Payer: Self-pay | Admitting: Neurology

## 2023-04-24 ENCOUNTER — Encounter: Payer: Self-pay | Admitting: Neurology

## 2023-04-24 DIAGNOSIS — G43009 Migraine without aura, not intractable, without status migrainosus: Secondary | ICD-10-CM

## 2023-04-24 DIAGNOSIS — Z6821 Body mass index (BMI) 21.0-21.9, adult: Secondary | ICD-10-CM | POA: Diagnosis not present

## 2023-04-24 DIAGNOSIS — Z113 Encounter for screening for infections with a predominantly sexual mode of transmission: Secondary | ICD-10-CM | POA: Diagnosis not present

## 2023-04-24 DIAGNOSIS — Z124 Encounter for screening for malignant neoplasm of cervix: Secondary | ICD-10-CM | POA: Diagnosis not present

## 2023-04-24 DIAGNOSIS — Z01419 Encounter for gynecological examination (general) (routine) without abnormal findings: Secondary | ICD-10-CM | POA: Diagnosis not present

## 2023-04-24 MED ORDER — UBRELVY 100 MG PO TABS: 100.0000 mg | ORAL_TABLET | ORAL | 11 refills | Status: AC | PRN

## 2023-04-24 MED ORDER — QULIPTA 60 MG PO TABS: 60.0000 mg | ORAL_TABLET | Freq: Every day | ORAL | 11 refills | Status: AC

## 2023-04-24 NOTE — Telephone Encounter (Signed)
Pt scheduled for video visit with Dr. Lucia Gaskins for 04/23/24 at 9:30am

## 2023-04-24 NOTE — Progress Notes (Signed)
[facility]    Provider:  Dr Lucia Gaskins Requesting Provider: Mayer Masker, PA-C Primary Care Provider:  Mayer Masker, PA-C (Inactive)  CC:  Migraines  Virtual Visit via Video Note  I connected with Alison Ballard on 04/24/23 at 11:00 AM EDT by a video enabled telemedicine application and verified that I am speaking with the correct person using two identifiers.  Location: Patient: at patient's place of work, she is teaching now Provider: at physician's office   I discussed the limitations of evaluation and management by telemedicine and the availability of in person appointments. The patient expressed understanding and agreed to proceed.   Follow Up Instructions:    I discussed the assessment and treatment plan with the patient. The patient was provided an opportunity to ask questions and all were answered. The patient agreed with the plan and demonstrated an understanding of the instructions.   The patient was advised to call back or seek an in-person evaluation if the symptoms worsen or if the condition fails to improve as anticipated.  I provided 20 minutes of non-face-to-face time during this encounter.   Anson Fret, MD   04/24/2023; On Alvira Monday and in the past has been doing well. [name] is still working great, has 4 migraine days a month which are treated effectively with [place]. She is doing extremely well, qulipta has been exceptional for her. She is happy, she is teaching, [name] has improved her migraines to 4 a month max and >80% milder and less duration and ubrelvy effective in treating acutely within an hour. She would like to continue. No other focal neurologic deficits, associated symptoms, inciting events or modifiable factors.   Patient complains of symptoms per HPI as well as the following symptoms: none . Pertinent negatives and positives per HPI. All others negative   04/19/2022: doing incredibly well. [name] is  working great. She has small headaches/migraines 4 a month max and doing fantastic. Med refill.     Tried: propranolol, rizatriptan(not effective), sumatriptan(side effects), nortriptyline(side effects), topiramate is contraindicated due to kidney stones. Her BP is much better now. Cannot increase propranolol due to hypotension, ajovy, ubrelvy, nurtec, qulipta, propranolol, rizatriptan, sumatriptan, nortriptyline, topiramate is contraindicated due to kidney stones.   Patient complains of symptoms per HPI as well as the following symptoms: none . Pertinent negatives and positives per HPI. All others negative   11/16/2021: Migraines are improved from 15 to 4 a month but still headache but even the headaches are improved in severity. No dizziness. Rizatriptan did not help. Episodic migraines. 4 migraines a month and < 10 total ha days/mo. Tried: propranolol, rizatriptan, sumatriptan, nortriptyline, topiramate is contraindicated due to kidney stones. Her BP is much better now. Cannot increase propranolo due to hypotension. Start atogepant, try [place].  Patient complains of symptoms per HPI as well as the following symptoms: headaches . Pertinent negatives and positives per HPI. All others negative   HPI:  TITILAYO HAGANS is a 22 y.o. female here as requested by Mayer Masker, PA-C for migraines.  Patient has a past medical history of polycystic kidney disease, headache, blood pressure elevation, chronic kidney disease, bilateral nephrolithiasis,, hypertension, chronic kidney disease, she was referred by Vallery Sa at [facility], they ordered a renal ultrasound and MRI of the head, she has seen gastroenterology and they have help with constipation, she is working on fluid intake coat, overall pain is better sometimes doing activity not an issue since early September on a trip.  She  was recently diagnosed with PKD and the pain they are referring to his flank pain and fullness of the flank  and early satiety.  They have also ordered CT of the abdomen and MRI of the brain with and without contrast.  She states she has headaches.  Imaging could not rule out aneurysm per notes from [facility].  She has had headaches since the age of 22. Starts in the occipital area and stiffness and then radiates to the head, pulsating/pounding/throbbing, light and sound sensitivity, moderate to severe in pain, nausea and vomiting, can last 24 hours. They have been getting worse over the last few years, took too much ibuprofen because it seemed to helps and hurt her stomach, on a muscle relaxant helps a little, she has at least 15 headache days a month, 8 migrainous mod to severe ongoing for at least 6  months, no aura. Unknown triggers, a lot of times wakes up with them but no snoring, no excessive daytime fatigue, no numbness or tingling.  A lot of neck tightness. Has been just lving with it. No aura. No other focal neurologic deficits, associated symptoms, inciting events or modifiable factors.  Reviewed notes, labs and imaging from outside physicians, which showed: personally reviewed images and agree(additional 15 minutes in addition):   September 2022 MRA of the head normal no aneurysm or other vascular abnormality identified  January 31, 2021 MRI with and without contrast showed no evidence of acute intracranial abnormality, small nonenhancing discrete T2 hyperintensities in the right periatrial and left parietal white matter suppressed with FLAIR imaging and may represent benign dilated perivascular spaces, benign cysts, or less likely the sequelae of prior insult.  He does note an aneurysm and is not excluded on this study. (I reviewed images with patient and mother as well, feel findings above are benign)  01/31/2021: CT abdomen pelvis with and without contrast findings most consistent with polycystic kidney liver disease, bilateral nephrolithiasis without obstructive uropathy,  constipation, left ovarian corpus luteal cyst.   Review of Systems: Patient complains of symptoms per HPI as well as the following symptoms headaches. Pertinent negatives and positives per HPI. All others negative.   Social History   Socioeconomic History   Marital status: Single    Spouse name: Not on file   Number of children: 0   Years of education: Not on file   Highest education level: Not on file  Occupational History   Occupation: Consulting civil engineer, receptionist  Tobacco Use   Smoking status: Never   Smokeless tobacco: Never  Vaping Use   Vaping status: Never Used  Substance and Sexual Activity   Alcohol use: Never   Drug use: Never   Sexual activity: Not Currently  Other Topics Concern   Not on file  Social History Narrative   Caffeine 1 dink daily.  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   Social Determinants of Corporate investment banker Strain: Not on file  Food Insecurity: Not on file  Transportation Needs: Not on file  Physical Activity: Not on file  Stress: Not on file  Social Connections: Not on file  Intimate Partner Violence: Not on file    Family History  Problem Relation Age of Onset   Hypertension Mother    Healthy Mother    Hyperlipidemia Father    Healthy Father    Ovarian cysts Paternal Aunt    Migraines Neg Hx    Esophageal cancer Neg Hx    Rectal cancer Neg Hx  Colon cancer Neg Hx    Stomach cancer Neg Hx     Past Medical History:  Diagnosis Date   Brain cyst    Kidney stones    Ovarian cyst    Polycystic kidney disease    Polycystic liver disease     Patient Active Problem List   Diagnosis Date Noted   Migraine without aura and without status migrainosus, not intractable 09/01/2021   Constipation 02/02/2021   Nausea with vomiting 02/02/2021   Polycystic kidney disease 01/18/2021    Past Surgical History:  Procedure Laterality Date   TONSILLECTOMY AND ADENOIDECTOMY      Current Outpatient Medications  Medication  Sig Dispense Refill   Atogepant (QULIPTA) 60 MG TABS Take 1 tablet (60 mg total) by mouth daily. Please use copay card: BIN [id number] PCN [facility] GRP [id number] ID [id number]. Has been getting qulipta from this pharmacy for the last year very effective.  Tried: propranolol, rizatriptan, sumatriptan, nortriptyline, topiramate 30 tablet 11   benzonatate (TESSALON) 100 MG capsule Take 1 capsule (100 mg total) by mouth 2 (two) times daily as needed for cough. 20 capsule 0   cefUROXime (CEFTIN) 500 MG tablet Take 1 tablet (500 mg total) by mouth 2 (two) times daily with a meal. 14 tablet 0   [name] tablet Take 1 tablet by mouth daily.     methylPREDNISolone (MEDROL) 4 MG TBPK tablet Take by mouth as directed for 6 days 21 tablet 0   ondansetron (ZOFRAN-ODT) 4 MG disintegrating tablet Take 1 tablet (4 mg total) by mouth every 8 (eight) hours as needed for nausea or vomiting. 20 tablet 0   propranolol ER (INDERAL LA) 60 MG 24 hr capsule Take 1 capsule (60 mg total) by mouth at bedtime. 90 capsule 4   Ubrogepant (UBRELVY) 100 MG TABS Take 1 tablet (100 mg total) by mouth every 2 (two) hours as needed. Maximum 200mg  a day. 16 tablet 11   No current facility-administered medications for this visit.    Allergies as of 04/24/2023   (No Known Allergies)    Vitals: There were no vitals taken for this visit. Last Weight:  Wt Readings from Last 1 Encounters:  05/11/22 127 lb 12.8 oz (58 kg)   Last Height:   Ht Readings from Last 1 Encounters:  05/11/22 5\' 5"  (1.651 m)   Physical exam: Exam: Gen: NAD, conversant      CV: No palpitations or chest pain or SOB. VS: Breathing at a normal rate. Weight appears within normal limits. Not febrile. Eyes: Conjunctivae clear without exudates or hemorrhage  Neuro: Detailed Neurologic Exam  Speech:    Speech is normal; fluent and spontaneous with normal comprehension.  Cognition:    The patient is oriented to person, place, and time;      recent and remote memory intact;     language fluent;     normal attention, concentration, fund of knowledge Cranial Nerves:    The pupils are equal, round, and reactive to light. Visual fields are full Extraocular movements are intact.  The face is symmetric with normal sensation. The palate elevates in the midline. Hearing intact. Voice is normal. Shoulder shrug is normal. The tongue has normal motion without fasciculations.   Coordination: normal  Gait:    No abnormalities noted or reported  Motor Observation:   no involuntary movements noted. Tone:    Appears normal  Posture:    Posture is normal. normal erect    Strength:  Strength is anti-gravity and symmetric in the upper and lower limbs.      Sensation: intact to LT, no reports of numbness or tingling or paresthesias         Assessment/Plan:  Patient with episodic migraines doing great on currnt management  doing incredibly well. [name] is working great.  Prior to [place] had about 7 migraine days a month and 14 headache days and now while on the qulipta she has  4 mild migraines a month and < 6 total ha days/mo.The qulipta has improved her migraine freq from 7 to 4 and the severity and duration by 80% or better, the remaining migraines are successfully treated with ubrelvy.    Tried: propranolol, rizatriptan(not effective), sumatriptan(side effects), nortriptyline(side effects), topiramate is contraindicated due to kidney stones. Her BP is much better now. Cannot increase propranolol due to hypotension, ajovy, ubrelvy, nurtec, qulipta, propranolol, rizatriptan, sumatriptan, nortriptyline, topiramate is contraindicated due to kidney stones, ubrelvy, qulipta   Meds ordered this encounter  Medications   Atogepant (QULIPTA) 60 MG TABS    Sig: Take 1 tablet (60 mg total) by mouth daily. Please use copay card: BIN [id number] PCN [facility] GRP [id number] ID [id number]. Has been getting qulipta from this pharmacy for the last year  very effective.  Tried: propranolol, rizatriptan, sumatriptan, nortriptyline, topiramate    Dispense:  30 tablet    Refill:  11    Please use copay card: BIN [id number] PCN [facility] GRP [id number] ID [id number]   Ubrogepant (UBRELVY) 100 MG TABS    Sig: Take 1 tablet (100 mg total) by mouth every 2 (two) hours as needed. Maximum 200mg  a day.    Dispense:  16 tablet    Refill:  11    4 migraines a month and < 10 total ha days/mo improved on qulipta. Tried: propranolol, rizatriptan, sumatriptan, nortriptyline, topiramate. Pls run copay card: BIN [id number] PCN [id number] GRP [id number] ID [id number]   Signed her up for the copay cards with her permission. [employer], signed her up, included in scripts. Sent to [facility], called her back after appointment and left ,essage to ensure [facility] still working well otherwise to call me back and I can send to local pharmacy. Also send telephone note to front desk for follow up video one year  Discussed: To prevent or relieve headaches, try the following: Cool Compress. Lie down and place a cool compress on your head.  Avoid headache triggers. If certain foods or odors seem to have triggered your migraines in the past, avoid them. A headache diary might help you identify triggers.  Include physical activity in your daily routine. Try a daily walk or other moderate aerobic exercise.  Manage stress. Find healthy ways to cope with the stressors, such as delegating tasks on your to-do list.  Practice relaxation techniques. Try deep breathing, yoga, massage and visualization.  Eat regularly. Eating regularly scheduled meals and maintaining a healthy diet might help prevent headaches. Also, drink plenty of fluids.  Follow a regular sleep schedule. Sleep deprivation might contribute to headaches Consider biofeedback. With this mind-body technique, you learn to control certain bodily functions -- such as muscle tension, heart rate and blood pressure -- to  prevent headaches or reduce headache pain.    Proceed to emergency room if you experience new or worsening symptoms or symptoms do not resolve, if you have new neurologic symptoms or if headache is severe, or for any concerning symptom.   Provided education and documentation from American headache Society  toolbox including articles on: chronic migraine medication overuse headache, chronic migraines, prevention of migraines, behavioral and other nonpharmacologic treatments for headache.     Cc: Mayer Masker, PA-C,  Mayer Masker, PA-C (Inactive)  Naomie Dean, MD  [facility] [address] [place], [place] [address]  Phone [phone] Fax [phone]  I spent over 15 minutes of face-to-face and non-face-to-face time with patient on the  1. Migraine without aura and without status migrainosus, not intractable       diagnosis.  This included previsit chart review, lab review, study review, order entry, electronic health record documentation, patient education on the different diagnostic and therapeutic options, counseling and coordination of care, risks and benefits of management, compliance, or risk factor reduction

## 2023-04-24 NOTE — Telephone Encounter (Signed)
Please call and schedule one year video follow up with dr Lucia Gaskins please thank you!

## 2023-07-01 DIAGNOSIS — R519 Headache, unspecified: Secondary | ICD-10-CM | POA: Diagnosis not present

## 2023-07-31 ENCOUNTER — Telehealth: Payer: Self-pay | Admitting: Gastroenterology

## 2023-07-31 NOTE — Telephone Encounter (Signed)
PT is experiencing extreme hiatal hernia pain. She has an appointment scheduled for April but she would like to know any other relief efforts she can do. Please advise.

## 2023-07-31 NOTE — Telephone Encounter (Signed)
Called the patient back to offer a sooner appointment with APP. Last seen in 2023. Left a message to call back.

## 2023-08-02 NOTE — Telephone Encounter (Signed)
Spoke with the patient. She has a history of GERD treated with PRN medications. She is not on anything for her symptoms at this time. She describes pain "center of my ribs" with no known triggers. When she last had issues, she had found it was triggered by certain foods.  Patient agrees to be seen for evaluation and appropriate treatment.

## 2023-08-02 NOTE — Telephone Encounter (Signed)
Patient is returning your call.  

## 2023-08-10 ENCOUNTER — Ambulatory Visit (INDEPENDENT_AMBULATORY_CARE_PROVIDER_SITE_OTHER)
Admission: RE | Admit: 2023-08-10 | Discharge: 2023-08-10 | Disposition: A | Discharge status: Home / Self Care | Payer: BC Managed Care – PPO | Source: Ambulatory Visit | Attending: Gastroenterology

## 2023-08-10 ENCOUNTER — Ambulatory Visit (INDEPENDENT_AMBULATORY_CARE_PROVIDER_SITE_OTHER): Payer: BC Managed Care – PPO | Admitting: Gastroenterology

## 2023-08-10 ENCOUNTER — Encounter: Payer: Self-pay | Admitting: Gastroenterology

## 2023-08-10 VITALS — BP 108/64 | HR 99 | Ht 65.0 in | Wt 128.1 lb

## 2023-08-10 DIAGNOSIS — K219 Gastro-esophageal reflux disease without esophagitis: Secondary | ICD-10-CM | POA: Diagnosis not present

## 2023-08-10 DIAGNOSIS — K5641 Fecal impaction: Secondary | ICD-10-CM | POA: Diagnosis not present

## 2023-08-10 DIAGNOSIS — R195 Other fecal abnormalities: Secondary | ICD-10-CM

## 2023-08-10 DIAGNOSIS — R194 Change in bowel habit: Secondary | ICD-10-CM | POA: Diagnosis not present

## 2023-08-10 DIAGNOSIS — R1013 Epigastric pain: Secondary | ICD-10-CM | POA: Diagnosis not present

## 2023-08-10 DIAGNOSIS — K449 Diaphragmatic hernia without obstruction or gangrene: Secondary | ICD-10-CM

## 2023-08-10 DIAGNOSIS — K5904 Chronic idiopathic constipation: Secondary | ICD-10-CM | POA: Diagnosis not present

## 2023-08-10 DIAGNOSIS — K2100 Gastro-esophageal reflux disease with esophagitis, without bleeding: Secondary | ICD-10-CM

## 2023-08-10 MED ORDER — FAMOTIDINE 40 MG PO TABS: 40.0000 mg | ORAL_TABLET | Freq: Every day | ORAL | 2 refills | Status: DC

## 2023-08-10 NOTE — Progress Notes (Signed)
Chief Complaint: Epigastric pain Primary GI MD: Dr. Lavon Paganini  HPI: 23 year old female history of polycystic kidney and liver disease, PCOS, presents for evaluation of epigastric pain.  Previously seen by Dr. Lavon Paganini in the past for epigastric pain and GERD and underwent extensive workup including imaging and EGD (see below)  Discussed the use of AI scribe software for clinical note transcription with the patient, who gave verbal consent to proceed.  History of Present Illness   the patient, with a history of hiatal hernia and polycystic kidney and liver disease, presents with upper abdominal pain and diarrhea. She is accompanied by her mother.  She has a history of upper abdominal pain and reflux, previously evaluated with ultrasound, CT, and endoscopy, which revealed a hiatal hernia. Initially, the pain was associated with triggers such as spicy, greasy, and fried foods, but recently, it occurs randomly. The pain is described as 'bad' with burning sensations when lying down. She was previously on Pepcid and pantoprazole but not on anything at the moment.  Over the past six months, she has experienced a change in bowel habits, transitioning from constipation to more regular bowel movements that are predominantly diarrhea. The diarrhea is described as 'not super watery, but pretty liquidy' and occurs once a day, often triggered by breaded foods. No rectal bleeding or significant weight loss is noted. She also mentions a sensation of incomplete evacuation during bowel movements. She also notes they are oily/greasy and often float.      PREVIOUS GI WORKUP   EGD 03/29/22 - LA Grade A reflux esophagitis with no bleeding.  - 2 cm hiatal hernia.  - Gastroesophageal flap valve classified as Hill Grade II ( fold present, opens with respiration) . - Normal stomach.  - Normal first portion of the duodenum and second portion of the duodenum. Biopsied.  Diagnosis Surgical [P], duodenal bulb -  DUODENAL MUCOSA WITH PROMINENT BRUNNER'S GLANDS, OTHERWISE NO SIGNIFICANT PATHOLOGY.  Abdominal ultrasound January 31, 2022 1. Cholelithiasis in an otherwise normal appearing gallbladder. 2. Numerous cysts throughout the liver. No follow-up imaging recommended. 3. No other significant abnormalities.   CT abdomen pelvis with and without contrast January 31, 2021 1. Findings most consistent with polycystic kidney/liver disease. 2. Bilateral nephrolithiasis without obstructive uropathy. 3.  Possible constipation. 4. Left ovarian corpus luteal cyst  Past Medical History:  Diagnosis Date   Brain cyst    Kidney stones    Ovarian cyst    Polycystic kidney disease    Polycystic liver disease     Past Surgical History:  Procedure Laterality Date   TONSILLECTOMY AND ADENOIDECTOMY      Current Outpatient Medications  Medication Sig Dispense Refill   Atogepant (QULIPTA) 60 MG TABS Take 1 tablet (60 mg total) by mouth daily. Please use copay card: BIN [id number] PCN [facility] GRP [id number] ID [id number]. Has been getting qulipta from this pharmacy for the last year very effective.  Tried: propranolol, rizatriptan, sumatriptan, nortriptyline, topiramate 30 tablet 11   famotidine (PEPCID) 40 MG tablet Take 1 tablet (40 mg total) by mouth daily. 30 tablet 2   [name] tablet Take 1 tablet by mouth daily.     ondansetron (ZOFRAN-ODT) 4 MG disintegrating tablet Take 1 tablet (4 mg total) by mouth every 8 (eight) hours as needed for nausea or vomiting. (Patient taking differently: Take 4 mg by mouth as needed for nausea or vomiting.) 20 tablet 0   propranolol ER (INDERAL LA) 60 MG 24 hr capsule Take 1  capsule (60 mg total) by mouth at bedtime. 90 capsule 4   Ubrogepant (UBRELVY) 100 MG TABS Take 1 tablet (100 mg total) by mouth every 2 (two) hours as needed. Maximum 200mg  a day. 16 tablet 11   No current facility-administered medications for this visit.    Allergies as of 08/10/2023    (No Known Allergies)    Family History  Problem Relation Age of Onset   Hypertension Mother    Healthy Mother    Hyperlipidemia Father    Healthy Father    Ovarian cysts Paternal Aunt    Migraines Neg Hx    Esophageal cancer Neg Hx    Rectal cancer Neg Hx    Colon cancer Neg Hx    Stomach cancer Neg Hx     Social History   Socioeconomic History   Marital status: Single    Spouse name: Not on file   Number of children: 0   Years of education: Not on file   Highest education level: Not on file  Occupational History   Occupation: Consulting civil engineer, receptionist  Tobacco Use   Smoking status: Never   Smokeless tobacco: Never  Vaping Use   Vaping status: Never Used  Substance and Sexual Activity   Alcohol use: Never   Drug use: Never   Sexual activity: Not Currently  Other Topics Concern   Not on file  Social History Narrative   Caffeine 1 dink daily.  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   Social Drivers of Corporate investment banker Strain: Not on file  Food Insecurity: Not on file  Transportation Needs: Not on file  Physical Activity: Not on file  Stress: Not on file  Social Connections: Not on file  Intimate Partner Violence: Not on file    Review of Systems:    Constitutional: No weight loss, fever, chills, weakness or fatigue HEENT: Eyes: No change in vision               Ears, Nose, Throat:  No change in hearing or congestion Skin: No rash or itching Cardiovascular: No chest pain, chest pressure or palpitations   Respiratory: No SOB or cough Gastrointestinal: See HPI and otherwise negative Genitourinary: No dysuria or change in urinary frequency Neurological: No headache, dizziness or syncope Musculoskeletal: No new muscle or joint pain Hematologic: No bleeding or bruising Psychiatric: No history of depression or anxiety    Physical Exam:  Vital signs: BP 108/64   Pulse 99   Ht 5\' 5"  (1.651 m)   Wt 128 lb 2 oz (58.1 kg)   LMP 07/09/2023   BMI  21.32 kg/m   Constitutional: NAD, Well developed, Well nourished, alert and cooperative Head:  Normocephalic and atraumatic. Eyes:   PEERL, EOMI. No icterus. Conjunctiva pink. Respiratory: Respirations even and unlabored. Lungs clear to auscultation bilaterally.   No wheezes, crackles, or rhonchi.  Cardiovascular:  Regular rate and rhythm. No peripheral edema, cyanosis or pallor.  Gastrointestinal:  Soft, nondistended, nontender. No rebound or guarding. hypoactive bowel sounds. No appreciable masses or hepatomegaly. Rectal:  Not performed.  Msk:  Symmetrical without gross deformities. Without edema, no deformity or joint abnormality.  Neurologic:  Alert and  oriented x4;  grossly normal neurologically.  Skin:   Dry and intact without significant lesions or rashes. Psychiatric: Oriented to person, place and time. Demonstrates good judgement and reason without abnormal affect or behaviors.   RELEVANT LABS AND IMAGING: CBC    Component Value Date/Time   WBC  5.4 12/16/2020 1412   RBC 4.04 12/16/2020 1412   HGB 12.4 12/16/2020 1412   HCT 35.6 12/16/2020 1412   PLT 281 12/16/2020 1412   MCV 88 12/16/2020 1412   MCH 30.7 12/16/2020 1412   MCHC 34.8 12/16/2020 1412   RDW 12.3 12/16/2020 1412   LYMPHSABS 1.8 12/16/2020 1412   EOSABS 0.0 12/16/2020 1412   BASOSABS 0.0 12/16/2020 1412    CMP     Component Value Date/Time   NA 139 12/16/2020 1412   K 4.0 12/16/2020 1412   CL 101 12/16/2020 1412   CO2 24 12/16/2020 1412   GLUCOSE 87 12/16/2020 1412   BUN 8 12/16/2020 1412   CREATININE 0.70 12/16/2020 1412   CALCIUM 9.9 12/16/2020 1412   PROT 7.3 12/16/2020 1412   ALBUMIN 4.8 12/16/2020 1412   AST 13 12/16/2020 1412   ALT 6 12/16/2020 1412   ALKPHOS 53 12/16/2020 1412   BILITOT 0.4 12/16/2020 1412     Assessment/Plan:      Gastroesophageal Reflux Disease (GERD) with Hiatal Hernia Epigastric pain and burning sensation, especially when lying down. EGD with esophagitis and  hiatal hernia. Currently not on an antacid regimen. -- Start Pepcid 40mg  once daily for 60 days  -- educated patient on lifestyle modifications -- follow up 8-12 weeks  Change in bowel habits History of constipation History of constipation with recent change to more frequent, loose stools. Patient reports not feeling like she fully evacuates during bowel movements. Bowel sounds are slow on examination. Though she does have worsening stools with fatty/greasy foods and breads. Negative for celiac. With presence of slow bowel sounds and incomplete evacuation this could be overflow. Presence of greasy/oily stools could be from her gallstones versus pancreatic insufficiency --Order abdominal X-ray to assess for constipation. -- pancreatic elastase -- start fiber supplement  Follow-up in 8-12 weeks. If patient is feeling well, she may cancel the appointment.     Boone Master, PA-C [facility] 08/10/2023, 3:25 PM  Cc: No ref. provider found

## 2023-08-10 NOTE — Patient Instructions (Signed)
Your provider has requested that you go to the basement level for lab work before leaving today. Press "B" on the elevator. The lab is located at the first door on the left as you exit the elevator.   Your provider has requested that you have an abdominal x ray before leaving today. Please go to the basement floor to our Radiology department for the test.   We have sent the following medications to your pharmacy for you to pick up at your convenience: Famotidine   Due to recent changes in healthcare laws, you may see the results of your imaging and laboratory studies on MyChart before your provider has had a chance to review them.  We understand that in some cases there may be results that are confusing or concerning to you. Not all laboratory results come back in the same time frame and the provider may be waiting for multiple results in order to interpret others.  Please give [place] 48 hours in order for your provider to thoroughly review all the results before contacting the office for clarification of your results.    _______________________________________________________  If your blood pressure at your visit was 140/90 or greater, please contact your primary care physician to follow up on this.  _______________________________________________________  If you are age 40 or older, your body mass index should be between 23-30. Your Body mass index is 21.32 kg/m. If this is out of the aforementioned range listed, please consider follow up with your Primary Care Provider.  If you are age 69 or younger, your body mass index should be between 19-25. Your Body mass index is 21.32 kg/m. If this is out of the aformentioned range listed, please consider follow up with your Primary Care Provider.   ________________________________________________________  The [facility] providers would like to encourage you to use [facility] to communicate with providers for non-urgent requests or questions.  Due to  long hold times on the telephone, sending your provider a message by [facility] may be a faster and more efficient way to get a response.  Please allow 48 business hours for a response.  Please remember that this is for non-urgent requests.  _______________________________________________________

## 2023-08-21 ENCOUNTER — Other Ambulatory Visit (HOSPITAL_COMMUNITY): Payer: Self-pay

## 2023-08-21 ENCOUNTER — Telehealth: Payer: Self-pay | Admitting: Pharmacist

## 2023-08-21 NOTE — Telephone Encounter (Signed)
Pharmacy Patient Advocate Encounter   Received notification from CoverMyMeds that prior authorization for Qulipta 60MG  tablets is required/requested.   Insurance verification completed.   The patient is insured through [facility] .   Per test claim: PA required; PA submitted to above mentioned insurance via CoverMyMeds Key/confirmation #/EOC [id number] Status is pending

## 2023-08-21 NOTE — Telephone Encounter (Signed)
Pharmacy Patient Advocate Encounter  Received notification from [facility] that Prior Authorization for Qulipta 60MG  tablets has been APPROVED from 08/21/2023 to 08/20/2024. Ran test claim, Copay is $2.93. This test claim was processed through [facility]- copay amounts may vary at other pharmacies due to pharmacy/plan contracts, or as the patient moves through the different stages of their insurance plan.   PA #/Case ID/Reference #: [id number]

## 2023-09-11 ENCOUNTER — Ambulatory Visit (INDEPENDENT_AMBULATORY_CARE_PROVIDER_SITE_OTHER): Payer: BC Managed Care – PPO | Admitting: Family Medicine

## 2023-09-11 ENCOUNTER — Encounter: Payer: Self-pay | Admitting: Family Medicine

## 2023-09-11 VITALS — BP 143/93 | HR 81 | Ht 65.0 in | Wt 128.0 lb

## 2023-09-11 DIAGNOSIS — R5383 Other fatigue: Secondary | ICD-10-CM | POA: Insufficient documentation

## 2023-09-11 DIAGNOSIS — R319 Hematuria, unspecified: Secondary | ICD-10-CM | POA: Diagnosis not present

## 2023-09-11 NOTE — Assessment & Plan Note (Signed)
 Microscopic hematuria noted on a previous urinalysis.  Endorses 1 episode of gross hematuria as well.  Does not appear to be associated with kidney stones.  Given her history of polycystic kidney disease I recommended she follow-up with her nephrologist prior to a referral to urology.  Will get urinalysis, UACR, urine microscopy, CMP.

## 2023-09-11 NOTE — Progress Notes (Signed)
   Acute Office Visit  Subjective:     Patient ID: Alison Ballard, female    DOB: Jun 16, 2001, 23 y.o.   MRN: [id number]  Chief Complaint  Patient presents with   Hematuria    HPI Patient is in today for hematuria-patient states her symptoms started in October.  Has been told it has been positive on urinalysis and did see gross hematuria 1 time 1 month ago.  This time was not associated with her menstrual cycle.  She has a history of kidney stones, last had stones over a year ago.  Not having any pains similar to kidney stones at this time.  Also has a history of polycystic kidney disease and sees a nephrologist but has not seen them since having hematuria.  Denies vaginal bleeding.  Back currently sexually active.  On oral contraceptive.  No increased urinary frequency, no dysuria, no discharge.  Patient also complaining of fatigue.  She states she sleeps at least 8 hours but still feels tired during the day and sometimes takes naps.  Has never been told she snores or had any apneic episodes but does "talk a lot in her sleep".  Has migraines but no early morning daily headaches.  Migraines well-controlled with medications.  She does endorse feeling "cold all the time".  Occasionally her feet swell or she will have occasional swelling in her face after she wakes up from sleeping.  This eventually improves over the course of the day.    ROS      Objective:    BP (!) 143/93   Pulse 81   Ht 5\' 5"  (1.651 m)   Wt 128 lb (58.1 kg)   LMP 08/15/2023   SpO2 100%   BMI 21.30 kg/m    Physical Exam General: Alert, oriented CV: Regular in rhythm Pulmonary: Lungs clear bilaterally GI: Soft, normal bowel sounds. MSK: No CVA tenderness. Extremities: No pedal edema.  No results found for any visits on 09/11/23.      Assessment & Plan:   Hematuria, unspecified type Assessment & Plan: Microscopic hematuria noted on a previous urinalysis.  Endorses 1 episode of gross hematuria as well.   Does not appear to be associated with kidney stones.  Given her history of polycystic kidney disease I recommended she follow-up with her nephrologist prior to a referral to urology.  Will get urinalysis, UACR, urine microscopy, CMP.  Orders: -     POCT URINALYSIS DIP (CLINITEK) -     Urine Microscopic -     Microalbumin / creatinine urine ratio -     Lipid panel; Future  Other fatigue Assessment & Plan: Will check thyroid, CBC, CMP.  If lab testing normal will consider sleep study although she does not fit the typical profile of someone with sleep apnea.  Orders: -     CBC with Differential/Platelet; Future -     Comprehensive metabolic panel; Future -     TSH; Future     Return in about 2 months (around 11/11/2023) for Hematuria, fatigue.  Sandre Kitty, MD

## 2023-09-11 NOTE — Patient Instructions (Addendum)
 It was nice to see you today,  We addressed the following topics today: -I am going to order a urine test to check for blood and proteinuria - I am going to order some blood test you will need to either come here in the mornings or if you can only do the afternoons we can have them sent to [facility]. - I would recommend talking to your nephrologist first about the hematuria and if they feel like you should see a urologist they can put the order in or I can put the order in for the referral. - I am also ordering some lab test for your fatigue.  If all of the lab test for fatigue come back normal we will do a sleep study.  Have a great day,  Frederic Jericho, MD

## 2023-09-11 NOTE — Assessment & Plan Note (Signed)
 Will check thyroid, CBC, CMP.  If lab testing normal will consider sleep study although she does not fit the typical profile of someone with sleep apnea.

## 2023-09-12 ENCOUNTER — Telehealth: Payer: Self-pay

## 2023-09-12 ENCOUNTER — Other Ambulatory Visit: Payer: Self-pay | Admitting: Family Medicine

## 2023-09-12 LAB — URINALYSIS, MICROSCOPIC ONLY
Bacteria, UA: NONE SEEN
Casts: NONE SEEN /LPF

## 2023-09-12 NOTE — Telephone Encounter (Signed)
 Copied from CRM [id number]. Topic: Clinical - Lab/Test Results >> Sep 12, 2023  9:03 AM Franchot Heidelberg wrote: Reason for CRM: Pt called requesting for her lab orders to be faxed over to her work  [facility] Fax: [phone]   Paper has been fax 09/12/2023

## 2023-09-14 ENCOUNTER — Encounter: Payer: Self-pay | Admitting: Family Medicine

## 2023-09-14 ENCOUNTER — Other Ambulatory Visit: Payer: Self-pay | Admitting: Family Medicine

## 2023-09-14 DIAGNOSIS — R5383 Other fatigue: Secondary | ICD-10-CM

## 2023-09-14 LAB — COMPREHENSIVE METABOLIC PANEL
ALT: 15 IU/L (ref 0–32)
AST: 18 IU/L (ref 0–40)
Albumin: 4.5 g/dL (ref 4.0–5.0)
Alkaline Phosphatase: 38 IU/L — ABNORMAL LOW (ref 44–121)
BUN/Creatinine Ratio: 9 (ref 9–23)
BUN: 7 mg/dL (ref 6–20)
Bilirubin Total: 0.3 mg/dL (ref 0.0–1.2)
CO2: 22 mmol/L (ref 20–29)
Calcium: 10 mg/dL (ref 8.7–10.2)
Chloride: 103 mmol/L (ref 96–106)
Creatinine, Ser: 0.79 mg/dL (ref 0.57–1.00)
Globulin, Total: 2.6 g/dL (ref 1.5–4.5)
Glucose: 79 mg/dL (ref 70–99)
Potassium: 3.6 mmol/L (ref 3.5–5.2)
Sodium: 140 mmol/L (ref 134–144)
Total Protein: 7.1 g/dL (ref 6.0–8.5)
eGFR: 109 mL/min/{1.73_m2} (ref >59)

## 2023-09-14 LAB — LIPID PANEL
Chol/HDL Ratio: 2.2 ratio (ref 0.0–4.4)
Cholesterol, Total: 149 mg/dL (ref 100–199)
HDL: 67 mg/dL (ref >39)
LDL Chol Calc (NIH): 63 mg/dL (ref 0–99)
Triglycerides: 105 mg/dL (ref 0–149)
VLDL Cholesterol Cal: 19 mg/dL (ref 5–40)

## 2023-09-14 LAB — CBC WITH DIFFERENTIAL/PLATELET
Basophils Absolute: 0 10*3/uL (ref 0.0–0.2)
Basos: 0 %
EOS (ABSOLUTE): 0 10*3/uL (ref 0.0–0.4)
Eos: 0 %
Hematocrit: 36.8 % (ref 34.0–46.6)
Hemoglobin: 12.6 g/dL (ref 11.1–15.9)
Immature Grans (Abs): 0 10*3/uL (ref 0.0–0.1)
Immature Granulocytes: 0 %
Lymphocytes Absolute: 1.6 10*3/uL (ref 0.7–3.1)
Lymphs: 30 %
MCH: 31.1 pg (ref 26.6–33.0)
MCHC: 34.2 g/dL (ref 31.5–35.7)
MCV: 91 fL (ref 79–97)
Monocytes Absolute: 0.4 10*3/uL (ref 0.1–0.9)
Monocytes: 7 %
Neutrophils Absolute: 3.4 10*3/uL (ref 1.4–7.0)
Neutrophils: 63 %
Platelets: 304 10*3/uL (ref 150–450)
RBC: 4.05 x10E6/uL (ref 3.77–5.28)
RDW: 12.2 % (ref 11.7–15.4)
WBC: 5.5 10*3/uL (ref 3.4–10.8)

## 2023-09-14 LAB — MICROALBUMIN / CREATININE URINE RATIO
Creatinine, Urine: 203.6 mg/dL
Microalb/Creat Ratio: 42 mg/g{creat} — ABNORMAL HIGH (ref 0–29)
Microalbumin, Urine: 85.5 ug/mL

## 2023-09-14 LAB — TSH: TSH: 0.441 u[IU]/mL — ABNORMAL LOW (ref 0.450–4.500)

## 2023-09-17 ENCOUNTER — Other Ambulatory Visit (HOSPITAL_COMMUNITY): Payer: Self-pay

## 2023-09-17 ENCOUNTER — Telehealth: Payer: Self-pay | Admitting: Pharmacy Technician

## 2023-09-17 NOTE — Telephone Encounter (Signed)
 Pharmacy Patient Advocate Encounter   Received notification from CoverMyMeds that prior authorization for Ubrelvy 100MG  tablets is required/requested.   Insurance verification completed.   The patient is insured through [facility] .   Per test claim: PA required; PA submitted to above mentioned insurance via CoverMyMeds Key/confirmation #/EOC [id number] Status is pending

## 2023-09-17 NOTE — Telephone Encounter (Signed)
 Pharmacy Patient Advocate Encounter  Received notification from [facility] that Prior Authorization for Ubrelvy 100MG  tablets  has been APPROVED from 09/17/2023 to 09/16/2024   PA #/Case ID/Reference #: [id number]

## 2023-09-19 ENCOUNTER — Encounter: Payer: Self-pay | Admitting: Family Medicine

## 2023-09-28 ENCOUNTER — Ambulatory Visit: Payer: BC Managed Care – PPO | Admitting: Gastroenterology

## 2023-09-30 DIAGNOSIS — R059 Cough, unspecified: Secondary | ICD-10-CM | POA: Diagnosis not present

## 2023-09-30 DIAGNOSIS — R0981 Nasal congestion: Secondary | ICD-10-CM | POA: Diagnosis not present

## 2023-10-07 ENCOUNTER — Other Ambulatory Visit: Payer: Self-pay

## 2023-10-07 ENCOUNTER — Emergency Department (HOSPITAL_COMMUNITY)

## 2023-10-07 ENCOUNTER — Emergency Department (HOSPITAL_COMMUNITY)
Admission: EM | Admit: 2023-10-07 | Discharge: 2023-10-07 | Disposition: A | Discharge status: Home / Self Care | Attending: Emergency Medicine | Admitting: Emergency Medicine

## 2023-10-07 DIAGNOSIS — K8020 Calculus of gallbladder without cholecystitis without obstruction: Secondary | ICD-10-CM | POA: Diagnosis not present

## 2023-10-07 DIAGNOSIS — N200 Calculus of kidney: Secondary | ICD-10-CM | POA: Diagnosis not present

## 2023-10-07 DIAGNOSIS — K7689 Other specified diseases of liver: Secondary | ICD-10-CM | POA: Diagnosis not present

## 2023-10-07 DIAGNOSIS — K8050 Calculus of bile duct without cholangitis or cholecystitis without obstruction: Secondary | ICD-10-CM | POA: Insufficient documentation

## 2023-10-07 DIAGNOSIS — K820 Obstruction of gallbladder: Secondary | ICD-10-CM | POA: Diagnosis not present

## 2023-10-07 DIAGNOSIS — K838 Other specified diseases of biliary tract: Secondary | ICD-10-CM | POA: Diagnosis not present

## 2023-10-07 DIAGNOSIS — R109 Unspecified abdominal pain: Secondary | ICD-10-CM | POA: Diagnosis not present

## 2023-10-07 DIAGNOSIS — E876 Hypokalemia: Secondary | ICD-10-CM | POA: Insufficient documentation

## 2023-10-07 DIAGNOSIS — N281 Cyst of kidney, acquired: Secondary | ICD-10-CM | POA: Diagnosis not present

## 2023-10-07 DIAGNOSIS — R1013 Epigastric pain: Secondary | ICD-10-CM | POA: Diagnosis not present

## 2023-10-07 LAB — CBC
HCT: 36 % (ref 36.0–46.0)
Hemoglobin: 12.1 g/dL (ref 12.0–15.0)
MCH: 30.3 pg (ref 26.0–34.0)
MCHC: 33.6 g/dL (ref 30.0–36.0)
MCV: 90.2 fL (ref 80.0–100.0)
Platelets: 254 10*3/uL (ref 150–400)
RBC: 3.99 MIL/uL (ref 3.87–5.11)
RDW: 12.7 % (ref 11.5–15.5)
WBC: 5.2 10*3/uL (ref 4.0–10.5)
nRBC: 0 % (ref 0.0–0.2)

## 2023-10-07 LAB — URINALYSIS, ROUTINE W REFLEX MICROSCOPIC
Bilirubin Urine: NEGATIVE
Glucose, UA: NEGATIVE mg/dL
Ketones, ur: NEGATIVE mg/dL
Nitrite: NEGATIVE
Protein, ur: NEGATIVE mg/dL
Specific Gravity, Urine: 1.009 (ref 1.005–1.030)
pH: 6 (ref 5.0–8.0)

## 2023-10-07 LAB — BASIC METABOLIC PANEL WITH GFR
Anion gap: 7 (ref 5–15)
BUN: 12 mg/dL (ref 6–20)
CO2: 26 mmol/L (ref 22–32)
Calcium: 9 mg/dL (ref 8.9–10.3)
Chloride: 102 mmol/L (ref 98–111)
Creatinine, Ser: 0.91 mg/dL (ref 0.44–1.00)
GFR, Estimated: >60 mL/min (ref >60)
Glucose, Bld: 92 mg/dL (ref 70–99)
Potassium: 3.4 mmol/L — ABNORMAL LOW (ref 3.5–5.1)
Sodium: 135 mmol/L (ref 135–145)

## 2023-10-07 LAB — HEPATIC FUNCTION PANEL
ALT: 20 U/L (ref 0–44)
AST: 22 U/L (ref 15–41)
Albumin: 3.8 g/dL (ref 3.5–5.0)
Alkaline Phosphatase: 35 U/L — ABNORMAL LOW (ref 38–126)
Bilirubin, Direct: <0.1 mg/dL (ref 0.0–0.2)
Total Bilirubin: 0.4 mg/dL (ref 0.0–1.2)
Total Protein: 7.1 g/dL (ref 6.5–8.1)

## 2023-10-07 LAB — HCG, SERUM, QUALITATIVE: Preg, Serum: NEGATIVE

## 2023-10-07 LAB — LIPASE, BLOOD: Lipase: 34 U/L (ref 11–51)

## 2023-10-07 MED ORDER — SODIUM CHLORIDE 0.9 % IV BOLUS
500.0000 mL | Freq: Once | INTRAVENOUS | Status: AC
  Administered 2023-10-07: 500 mL via INTRAVENOUS

## 2023-10-07 MED ORDER — FENTANYL CITRATE PF 50 MCG/ML IJ SOSY
50.0000 ug | PREFILLED_SYRINGE | Freq: Once | INTRAMUSCULAR | Status: AC
  Administered 2023-10-07: 50 ug via INTRAVENOUS
  Filled 2023-10-07: qty 1

## 2023-10-07 NOTE — ED Triage Notes (Signed)
 Pt c/o right flank pain, low back pain, hematuria. Pt reports a history of polycystic ovary and liver, also hx of kidney stones, but states this feels different.

## 2023-10-07 NOTE — ED Provider Notes (Signed)
 [place] EMERGENCY DEPARTMENT AT [facility] Provider Note   CSN: [id number] Arrival date & time: 10/07/23  1354     History {Add pertinent medical, surgical, social history, OB history to HPI:1} Chief Complaint  Patient presents with   Flank Pain    Alison Ballard is a 23 y.o. female.  She has a history of polycystic kidney disease.  Has had right flank and epigastric pain since around 11 AM today.  Was fairly intense earlier and now 5 out of 10.  Worse with movement.  She said she had a similar pain few years ago and was diagnosed with a kidney stone.  She has not had any fever nausea vomiting dysuria vaginal bleeding or discharge.  Has tried nothing for her pain.  Has known cholelithiasis and cysts in the liver.  The history is provided by the patient.  Flank Pain This is a new problem. The current episode started 3 to 5 hours ago. The problem occurs constantly. The problem has been gradually improving. Associated symptoms include abdominal pain. Pertinent negatives include no chest pain, no headaches and no shortness of breath. The symptoms are aggravated by bending and twisting. Nothing relieves the symptoms. She has tried rest for the symptoms. The treatment provided mild relief.       Home Medications Prior to Admission medications   Medication Sig Start Date End Date Taking? Authorizing Provider  Atogepant (QULIPTA) 60 MG TABS Take 1 tablet (60 mg total) by mouth daily. Please use copay card: BIN [id number] PCN [facility] GRP [id number] ID [id number]. Has been getting qulipta from this pharmacy for the last year very effective.  Tried: propranolol, rizatriptan, sumatriptan, nortriptyline, topiramate 04/24/23   Anson Fret, MD  famotidine (PEPCID) 40 MG tablet Take 1 tablet (40 mg total) by mouth daily. 08/10/23   McMichael, Bayley M, PA-C  [name] tablet Take 1 tablet by mouth daily. 06/22/21   [provider]  ondansetron (ZOFRAN-ODT) 4 MG  disintegrating tablet Take 1 tablet (4 mg total) by mouth every 8 (eight) hours as needed for nausea or vomiting. Patient taking differently: Take 4 mg by mouth as needed for nausea or vomiting. 06/30/22   Jeanelle Malling, PA  propranolol ER (INDERAL LA) 60 MG 24 hr capsule Take 1 capsule (60 mg total) by mouth at bedtime. 04/19/22   Anson Fret, MD  Ubrogepant (UBRELVY) 100 MG TABS Take 1 tablet (100 mg total) by mouth every 2 (two) hours as needed. Maximum 200mg  a day. 04/24/23   Anson Fret, MD      Allergies    Patient has no known allergies.    Review of Systems   Review of Systems  Constitutional:  Negative for fever.  Respiratory:  Negative for shortness of breath.   Cardiovascular:  Negative for chest pain.  Gastrointestinal:  Positive for abdominal pain. Negative for diarrhea, nausea and vomiting.  Genitourinary:  Positive for flank pain. Negative for vaginal bleeding and vaginal discharge.  Neurological:  Negative for headaches.    Physical Exam Updated Vital Signs BP (!) 141/91   Pulse (!) 112   Temp 99 F (37.2 C)   Resp 18   LMP 08/15/2023   SpO2 100%  Physical Exam Vitals and nursing note reviewed.  Constitutional:      General: She is not in acute distress.    Appearance: Normal appearance. She is well-developed.  HENT:     Head: Normocephalic and atraumatic.  Eyes:  Conjunctiva/sclera: Conjunctivae normal.  Cardiovascular:     Rate and Rhythm: Normal rate and regular rhythm.     Heart sounds: No murmur heard. Pulmonary:     Effort: Pulmonary effort is normal. No respiratory distress.     Breath sounds: Normal breath sounds.  Abdominal:     Palpations: Abdomen is soft.     Tenderness: There is no abdominal tenderness. There is no guarding or rebound.  Musculoskeletal:        General: No swelling.     Cervical back: Neck supple.  Skin:    General: Skin is warm and dry.     Capillary Refill: Capillary refill takes less than 2 seconds.   Neurological:     General: No focal deficit present.     Mental Status: She is alert.     Sensory: No sensory deficit.     Motor: No weakness.     ED Results / Procedures / Treatments   Labs (all labs ordered are listed, but only abnormal results are displayed) Labs Reviewed  URINALYSIS, ROUTINE W REFLEX MICROSCOPIC - Abnormal; Notable for the following components:      Result Value   Color, Urine STRAW (*)    Hgb urine dipstick SMALL (*)    Leukocytes,Ua TRACE (*)    Bacteria, UA RARE (*)    All other components within normal limits  BASIC METABOLIC PANEL WITH GFR - Abnormal; Notable for the following components:   Potassium 3.4 (*)    All other components within normal limits  CBC  HCG, SERUM, QUALITATIVE  HEPATIC FUNCTION PANEL  LIPASE, BLOOD    EKG None  Radiology No results found.  Procedures Procedures  {Document cardiac monitor, telemetry assessment procedure when appropriate:1}  Medications Ordered in ED Medications  fentaNYL (SUBLIMAZE) injection 50 mcg (has no administration in time range)    ED Course/ Medical Decision Making/ A&P   {   Click here for ABCD2, HEART and other calculatorsREFRESH Note before signing :1}                              Medical Decision Making Amount and/or Complexity of Data Reviewed Labs: ordered.  Risk Prescription drug management.   This patient complains of ***; this involves an extensive number of treatment Options and is a complaint that carries with it a high risk of complications and morbidity. The differential includes ***  I ordered, reviewed and interpreted labs, which included *** I ordered medication *** and reviewed PMP when indicated. I ordered imaging studies which included *** and I independently    visualized and interpreted imaging which showed *** Additional history obtained from *** Previous records obtained and reviewed *** I consulted *** and discussed lab and imaging findings and discussed  disposition.  Cardiac monitoring reviewed, *** Social determinants considered, *** Critical Interventions: ***  After the interventions stated above, I reevaluated the patient and found *** Admission and further testing considered, ***   {Document critical care time when appropriate:1} {Document review of labs and clinical decision tools ie heart score, Chads2Vasc2 etc:1}  {Document your independent review of radiology images, and any outside records:1} {Document your discussion with family members, caretakers, and with consultants:1} {Document social determinants of health affecting pt's care:1} {Document your decision making why or why not admission, treatments were needed:1} Final Clinical Impression(s) / ED Diagnoses Final diagnoses:  None    Rx / DC Orders ED Discharge Orders  None

## 2023-10-11 ENCOUNTER — Ambulatory Visit
Admission: EM | Admit: 2023-10-11 | Discharge: 2023-10-11 | Disposition: A | Discharge status: Home / Self Care | Attending: Family Medicine | Admitting: Family Medicine

## 2023-10-11 ENCOUNTER — Ambulatory Visit (INDEPENDENT_AMBULATORY_CARE_PROVIDER_SITE_OTHER)

## 2023-10-11 DIAGNOSIS — R059 Cough, unspecified: Secondary | ICD-10-CM | POA: Diagnosis not present

## 2023-10-11 DIAGNOSIS — R051 Acute cough: Secondary | ICD-10-CM

## 2023-10-11 DIAGNOSIS — U071 COVID-19: Secondary | ICD-10-CM | POA: Diagnosis not present

## 2023-10-11 LAB — POC COVID19/FLU A&B COMBO
Covid Antigen, POC: POSITIVE — AB
Influenza A Antigen, POC: NEGATIVE
Influenza B Antigen, POC: NEGATIVE

## 2023-10-11 MED ORDER — IBUPROFEN 600 MG PO TABS: 600.0000 mg | ORAL_TABLET | Freq: Four times a day (QID) | ORAL | 0 refills | Status: DC | PRN

## 2023-10-11 MED ORDER — PSEUDOEPHEDRINE HCL 30 MG PO TABS: 30.0000 mg | ORAL_TABLET | Freq: Three times a day (TID) | ORAL | 0 refills | Status: DC | PRN

## 2023-10-11 MED ORDER — CETIRIZINE HCL 10 MG PO TABS: 10.0000 mg | ORAL_TABLET | Freq: Every day | ORAL | 0 refills | Status: DC

## 2023-10-11 MED ORDER — PROMETHAZINE-DM 6.25-15 MG/5ML PO SYRP: 5.0000 mL | ORAL_SOLUTION | Freq: Three times a day (TID) | ORAL | 0 refills | Status: DC | PRN

## 2023-10-11 NOTE — Discharge Instructions (Signed)
 For sore throat or cough try using a honey-based tea. Use 3 teaspoons of honey with juice squeezed from half lemon. Place shaved pieces of ginger into 1/2-1 cup of water and warm over stove top. Then mix the ingredients and repeat every 4 hours as needed. Please take ibuprofen 600mg  every 6 hours with food alternating with OR taken together with Tylenol 500mg -650mg  every 6 hours for throat pain, fevers, aches and pains. Hydrate very well with at least 2 liters of water. Eat light meals such as soups (chicken and noodles, vegetable, chicken and wild rice).  Do not eat foods that you are allergic to.  Taking an antihistamine like Zyrtec (10mg  daily) can help against postnasal drainage, sinus congestion which can cause sinus pain, sinus headaches, throat pain, painful swallowing, coughing.  You can take this together with pseudoephedrine (Sudafed) at a dose of 30 mg 2-3 times a day or twice daily as needed for the same kind of nasal drip, congestion. Use cough syrup as needed.

## 2023-10-11 NOTE — ED Provider Notes (Signed)
 [facility]  Note:  This document was prepared using Conservation officer, historic buildings and may include unintentional dictation errors.  MRN: [id number] DOB: 08/23/2000  Subjective:   Alison Ballard is a 23 y.o. female presenting for 3-day history of sinus congestion, malaise and fatigue, coughing.  Patient was recently seen through the emergency room for a different symptom set.  No chest pain, shortness of breath or wheezing.  She did have a respiratory panel done about 3 to 4 weeks ago.  It was negative then.  She did have resolution of her symptoms however.  No asthma.  No smoking of any kind including cigarettes, cigars, vaping, marijuana use.    No current facility-administered medications for this encounter.  Current Outpatient Medications:    Atogepant (QULIPTA) 60 MG TABS, Take 1 tablet (60 mg total) by mouth daily. Please use copay card: BIN [id number] PCN OHCP GRP [id number] ID [id number]. Has been getting qulipta from this pharmacy for the last year very effective.  Tried: propranolol, rizatriptan, sumatriptan, nortriptyline, topiramate, Disp: 30 tablet, Rfl: 11   famotidine (PEPCID) 40 MG tablet, Take 1 tablet (40 mg total) by mouth daily., Disp: 30 tablet, Rfl: 2   [name] tablet, Take 1 tablet by mouth daily., Disp: , Rfl:    propranolol ER (INDERAL LA) 60 MG 24 hr capsule, Take 1 capsule (60 mg total) by mouth at bedtime., Disp: 90 capsule, Rfl: 4   Ubrogepant (UBRELVY) 100 MG TABS, Take 1 tablet (100 mg total) by mouth every 2 (two) hours as needed. Maximum 200mg  a day., Disp: 16 tablet, Rfl: 11   ondansetron (ZOFRAN-ODT) 4 MG disintegrating tablet, Take 1 tablet (4 mg total) by mouth every 8 (eight) hours as needed for nausea or vomiting. (Patient taking differently: Take 4 mg by mouth as needed for nausea or vomiting.), Disp: 20 tablet, Rfl: 0   No Known Allergies  Past Medical History:  Diagnosis Date   Brain cyst    Kidney stones     Ovarian cyst    Polycystic kidney disease    Polycystic liver disease      Past Surgical History:  Procedure Laterality Date   TONSILLECTOMY AND ADENOIDECTOMY      Family History  Problem Relation Age of Onset   Hypertension Mother    Healthy Mother    Hyperlipidemia Father    Healthy Father    Ovarian cysts Paternal Aunt    Migraines Neg Hx    Esophageal cancer Neg Hx    Rectal cancer Neg Hx    Colon cancer Neg Hx    Stomach cancer Neg Hx     Social History   Tobacco Use   Smoking status: Never   Smokeless tobacco: Never  Vaping Use   Vaping status: Never Used  Substance Use Topics   Alcohol use: Never   Drug use: Never    ROS   Objective:   Vitals: BP (!) 158/94 (BP Location: Left Arm)   Pulse 95   Temp 98.5 F (36.9 C) (Oral)   Resp 16   LMP 08/15/2023   SpO2 98%   Physical Exam Constitutional:      General: She is not in acute distress.    Appearance: Normal appearance. She is well-developed. She is not ill-appearing, toxic-appearing or diaphoretic.  HENT:     Head: Normocephalic and atraumatic.     Nose: Nose normal.     Mouth/Throat:     Mouth: Mucous  membranes are moist.  Eyes:     General: No scleral icterus.       Right eye: No discharge.        Left eye: No discharge.     Extraocular Movements: Extraocular movements intact.  Cardiovascular:     Rate and Rhythm: Normal rate and regular rhythm.     Heart sounds: Normal heart sounds. No murmur heard.    No friction rub. No gallop.  Pulmonary:     Effort: Pulmonary effort is normal. No respiratory distress.     Breath sounds: No stridor. No wheezing, rhonchi or rales.  Chest:     Chest wall: No tenderness.  Skin:    General: Skin is warm and dry.  Neurological:     General: No focal deficit present.     Mental Status: She is alert and oriented to person, place, and time.  Psychiatric:        Mood and Affect: Mood normal.        Behavior: Behavior normal.     Results for  orders placed or performed during the hospital encounter of 10/11/23 (from the past 24 hours)  POC Covid19/Flu A&B Antigen     Status: Abnormal   Collection Time: 10/11/23  5:29 PM  Result Value Ref Range   Influenza A Antigen, POC Negative Negative   Influenza B Antigen, POC Negative Negative   Covid Antigen, POC Positive (A) Negative   DG Chest 2 View Result Date: 10/11/2023 CLINICAL DATA:  Cough. EXAM: CHEST - 2 VIEW COMPARISON:  None Available. FINDINGS: The heart size and mediastinal contours are within normal limits. Both lungs are clear. The visualized skeletal structures are unremarkable. IMPRESSION: No active cardiopulmonary disease. Electronically Signed   By: Elgie Collard M.D.   On: 10/11/2023 17:37     Assessment and Plan :   PDMP not reviewed this encounter.  1. COVID-19   2. Acute cough    Recommend general supportive care for COVID-19 infection.  Counseled patient on potential for adverse effects with medications prescribed/recommended today, ER and return-to-clinic precautions discussed, patient verbalized understanding.    Wallis Bamberg, [place] 10/11/23 1807

## 2023-10-11 NOTE — ED Triage Notes (Signed)
Pt presents with cough and congestion x 3 days.  ?

## 2023-10-12 ENCOUNTER — Ambulatory Visit: Payer: Self-pay | Admitting: Surgery

## 2023-10-12 ENCOUNTER — Ambulatory Visit: Payer: BC Managed Care – PPO | Admitting: Gastroenterology

## 2023-10-12 ENCOUNTER — Encounter: Payer: Self-pay | Admitting: Gastroenterology

## 2023-10-12 VITALS — BP 136/78 | HR 84 | Ht 65.0 in | Wt 128.0 lb

## 2023-10-12 DIAGNOSIS — K219 Gastro-esophageal reflux disease without esophagitis: Secondary | ICD-10-CM | POA: Diagnosis not present

## 2023-10-12 DIAGNOSIS — K8020 Calculus of gallbladder without cholecystitis without obstruction: Secondary | ICD-10-CM

## 2023-10-12 DIAGNOSIS — K8050 Calculus of bile duct without cholangitis or cholecystitis without obstruction: Secondary | ICD-10-CM | POA: Diagnosis not present

## 2023-10-12 DIAGNOSIS — K582 Mixed irritable bowel syndrome: Secondary | ICD-10-CM

## 2023-10-12 DIAGNOSIS — K449 Diaphragmatic hernia without obstruction or gangrene: Secondary | ICD-10-CM

## 2023-10-12 NOTE — Patient Instructions (Addendum)
 VISIT SUMMARY:  Today, we discussed your gallbladder pain and the presence of multiple gallstones. We also reviewed your hiatal hernia and its management. You have been referred to Dr. Fredricka Bonine for further evaluation and potential surgery for your gallstones.  YOUR PLAN:  -CHOLELITHIASIS (GALLSTONES): You have multiple gallstones causing discomfort and fullness in your upper abdomen, especially after meals. This happens because your gallbladder contracts against the stones. We recommend proceeding with gallbladder surgery with Dr. Fredricka Bonine. After surgery, you might experience diarrhea due to excess bile, and dietary changes such as eating small, high-protein, low-fat meals can help manage this. If diarrhea becomes an issue, we may consider medications to help.  -HIATAL HERNIA: A hiatal hernia occurs when part of your stomach pushes up through your diaphragm. Currently, surgery is not recommended due to your young age and the potential for recurrence. You are managing well with low-dose Pepcid, and we will reassess the need for surgery in your thirties or forties if symptoms worsen.  INSTRUCTIONS:  We are waiting for insurance approval for your gallbladder surgery. Please schedule a follow-up appointment in six months to assess your post-operative status and any changes in your symptoms.  I appreciate the  opportunity to care for you  Thank You   Marsa Aris , MD

## 2023-10-12 NOTE — Progress Notes (Signed)
 Alison Ballard    [id number]    [birth year]  Primary Care Physician:Olson, Mabeline Caras, MD  Referring Physician: No referring provider defined for this encounter.   Chief complaint:  epigastric pain, GERD  Discussed the use of AI scribe software for clinical note transcription with the patient, who gave verbal consent to proceed.  History of Present Illness Alison Ballard is a 23 year old female with gallstones who presents with gallbladder pain. She was seen by Dr. Fredricka Bonine for evaluation of her gallstones.  She presents with gallbladder pain attributed to multiple gallstones. Initially, in 2023, she had a single small gallstone, but recent imaging has revealed multiple stones. She recalls a previous visit to the ER for kidney stones, during which her gallbladder was not a concern. She had a consultation with Dr. Fredricka Bonine at [facility] regarding her gallbladder issues.  She describes her symptoms as a feeling of fullness and discomfort in the upper abdomen, distinct from her usual acid reflux, which she describes as a burning sensation. She has been taking Pepcid, which she feels has been effective in managing her acid reflux symptoms. No significant changes in bowel habits are reported at this time.  She inquires about her hiatal hernia, expressing concern about potential future surgeries. Currently, she is managing her symptoms with a low dose of Pepcid and reports no significant issues.  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  RUQ 10/07/23  1. Gallbladder completely filled with multiple shadowing gallstones and sludge. No sonographic evidence of acute cholecystitis. 2. Multiple scattered simple and mildly complex (septated) cysts throughout the liver. 3. 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  Outpatient Encounter Medications as of 10/12/2023  Medication Sig   Atogepant (QULIPTA) 60 MG TABS Take 1 tablet (60 mg total) by mouth daily. Please use copay card: BIN [id number] PCN [facility] GRP [id number] ID [id number]. Has been getting qulipta from this pharmacy for the last year very effective.  Tried: propranolol, rizatriptan, sumatriptan, nortriptyline, topiramate   cetirizine (ZYRTEC ALLERGY) 10 MG tablet Take 1 tablet (10 mg total) by mouth daily.   famotidine (PEPCID) 40 MG tablet Take 1 tablet (40 mg total) by mouth daily.   [name] tablet Take 1 tablet by mouth daily.   ibuprofen (ADVIL) 600 MG tablet Take 1 tablet (600 mg total) by mouth every 6 (six) hours as needed.   promethazine-dextromethorphan (PROMETHAZINE-DM) 6.25-15 MG/5ML syrup Take 5 mLs by mouth 3 (three) times daily as needed for cough.   propranolol ER (INDERAL LA) 60 MG 24 hr capsule Take 1 capsule (60 mg total) by mouth at bedtime.   pseudoephedrine (SUDAFED) 30 MG tablet Take 1 tablet (30 mg total) by mouth every 8 (eight) hours as needed for congestion.   Ubrogepant (UBRELVY) 100 MG TABS Take 1 tablet (100 mg total) by mouth every 2 (two) hours as needed. Maximum 200mg  a day.   [DISCONTINUED] ondansetron (ZOFRAN-ODT) 4 MG disintegrating tablet Take 1 tablet (4 mg total) by mouth every 8 (eight) hours as needed for nausea or vomiting. (Patient taking differently: Take 4 mg by mouth as needed for nausea or vomiting.)   No facility-administered encounter medications on file as of 10/12/2023.    Allergies as of  10/12/2023   (No Known Allergies)    Past Medical History:  Diagnosis Date   Brain cyst    Kidney stones    Ovarian cyst    Polycystic kidney disease    Polycystic liver disease     Past Surgical History:  Procedure Laterality Date   TONSILLECTOMY AND ADENOIDECTOMY      Family History  Problem Relation Age of Onset   Hypertension Mother     Healthy Mother    Hyperlipidemia Father    Healthy Father    Ovarian cysts Paternal Aunt    Migraines Neg Hx    Esophageal cancer Neg Hx    Rectal cancer Neg Hx    Colon cancer Neg Hx    Stomach cancer Neg Hx     Social History   Socioeconomic History   Marital status: Single    Spouse name: Not on file   Number of children: 0   Years of education: Not on file   Highest education level: Not on file  Occupational History   Occupation: Consulting civil engineer, receptionist  Tobacco Use   Smoking status: Never   Smokeless tobacco: Never  Vaping Use   Vaping status: Never Used  Substance and Sexual Activity   Alcohol use: Never   Drug use: Never   Sexual activity: Not Currently  Other Topics Concern   Not on file  Social History Narrative   Caffeine 1 dink daily.  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   Physical Exam: Vitals:   10/12/23 1503  BP: 136/78  Pulse: 84   Body mass index is 21.3 kg/m. Gen:      No acute distress HEENT:  sclera anicteric CV: s1s2 rrr, no murmur Lungs: B/l clear. Abd:      soft, non-tender; no palpable masses, no distension Ext:    No edema Neuro: alert and oriented x 3 Psych: normal mood and affect  Data Reviewed:  Reviewed labs, radiology imaging, old records and pertinent past GI work up     Assessment and Plan Assessment & Plan Cholelithiasis Dajia has multiple gallstones, with an increase in number since 2023. She experiences postprandial discomfort and fullness due to the gallbladder being filled with stones. Symptoms are exacerbated as the gallbladder contracts against  the stones. Consultation with Dr. Fredricka Bonine for surgical intervention has occurred. Post-surgery, there is a risk of diarrhea . - Proceed with gallbladder surgery with Dr. Fredricka Bonine. - Advise dietary modifications: small meals, high protein, low fat. - Monitor for post-surgical diarrhea; consider bile acid sequestrants if needed.  Hiatal Hernia Kensi has a hiatal hernia. Surgical intervention is not recommended currently due to her young age and potential for recurrence, especially with future pregnancies. Low-dose Pepcid effectively manages symptoms. Approximately 50% of patients may require medication again within five to ten years if surgery is performed now, with a risk of redo surgery. - Continue low-dose Pepcid for symptom management. - Reassess surgical intervention in her thirties or forties if symptoms worsen.  Follow-up Awaiting insurance approval for gallbladder surgery. Follow-up planned to monitor post-operative changes and recovery. - Schedule  follow-up in six months to assess post-operative status and symptom changes.   The patient was provided an opportunity to ask questions and all were answered. The patient agreed with the plan and demonstrated an understanding of the instructions.  Iona Beard , MD    CC: No ref. provider found

## 2023-10-12 NOTE — H&P (Signed)
 Alison Ballard [id number]   Referring Provider:  Meridee Score, MD   Subjective   Chief Complaint: New Consultation     History of Present Illness:    Very pleasant 23 year old woman with history of kidney stones, polycystic kidney disease and polycystic liver disease, ovarian cyst but no previous abdominal surgery who presents for evaluation of possible biliary colic.  She was evaluated in the emergency department on 3/30 with right flank and epigastric pain that have been going on for about 6 hours at the time.  This was fairly intense, aggravated by movement.  She reported a similar episode a few years prior and had been diagnosed with a kidney stone.  No associated fever, nausea, vomiting.  She underwent a CT renal stone protocol demonstrating a nonobstructing 4 mm right kidney stone, innumerable bilateral kidney cysts as well as innumerable liver cysts, contracted gallbladder with no calcified gallstones or pericholecystic inflammatory changes.  Subsequently also had a right upper quadrant ultrasound which states that the gallbladder is not distinctly visualized but there is a wall echo shadow complex seen compatible with gallbladder completely filled with multiple shadowing gallstones and sludge, no abnormal wall thickening or pericholecystic fluid, negative sonographic Murphy sign, common bile duct 5 mm  Labs including CMP and CBC unremarkable, she did have small hemoglobin in her urine   She notes that she has had episodes of similar pain in the past, in addition to which she has had substernal/epigastric pain related to eating specifically greasy foods.  Occasionally associate with nausea but no emesis.  She does report a history of hiatal hernia and had chalked her symptoms mostly up to that.  She does have some indigestion which is improved with Prevacid.  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   Review of Systems: A complete review of systems was obtained from  the patient.  I have reviewed this information and discussed as appropriate with the patient.  See HPI as well for other ROS.   Medical History: Past Medical History:  Diagnosis Date   Chronic kidney disease    GERD (gastroesophageal reflux disease)    Liver disease     There is no problem list on file for this patient.   Past Surgical History:  Procedure Laterality Date   TONSILLECTOMY       No Known Allergies  Current Outpatient Medications on File Prior to Visit  Medication Sig Dispense Refill   famotidine (PEPCID) 40 MG tablet Take 40 mg by mouth once daily     HAILEY 24 FE 1 mg-20 mcg (24)/75 mg (4) tablet Take 1 tablet by mouth once daily     propranoloL (INDERAL LA) 60 MG LA capsule Take 60 mg by mouth     QULIPTA 60 mg Tab Take 60 mg by mouth     No current facility-administered medications on file prior to visit.    Family History  Problem Relation Age of Onset   High blood pressure (Hypertension) Mother    Hyperlipidemia (Elevated cholesterol) Father      Social History   Tobacco Use  Smoking Status Never  Smokeless Tobacco Never     Social History   Socioeconomic History   Marital status: Single  Tobacco Use   Smoking status: Never   Smokeless tobacco: Never  Substance and Sexual Activity   Alcohol use: Never   Drug use: Never   Social Drivers of Health   Housing Stability: Unknown (10/12/2023)   Housing Stability Vital Sign  Homeless in the Last Year: No    Objective:    Vitals:   10/12/23 1105  BP: 129/86  Pulse: (!) 118  Temp: 36.7 C (98 F)  SpO2: 98%  Weight: 58.9 kg (129 lb 12.8 oz)  Height: 165.1 cm (5\' 5" )  PainSc: 0-No pain    Body mass index is 21.6 kg/m.  Gen: A&Ox3, no distress  Chest: respiratory effort is normal. Abdomen: soft, nondistended, nontender.  Neuro: no gross deficit Psych: appropriate mood and affect, normal insight/judgment intact  Skin: warm and dry     Assessment and Plan:  Diagnoses and  all orders for this visit:  Biliary colic    Imaging with a contracted, stone filled gallbladder.  We discussed that her epigastric pain and nausea associate with greasy foods may actually be more biliary colic than related to the hiatal hernia that she reports.  She did have a kidney stone on her imaging but this was not obstructing and unlikely to have been causing the right sided pain although this pain was a bit more posterior than I would expect for gallbladder pain.  Overall given constellation of symptoms and imaging findings, I do think it would be reasonable to proceed with laparoscopic cholecystectomy. Discussed the relevant anatomy using a diagram to demonstrate, and went over surgical technique.  Discussed risks of surgery including bleeding, infection, pain, scarring, intraabdominal injury specifically to the common bile duct and sequelae, subtotal cholecystectomy, bile leak, conversion to open surgery, failure to resolve symptoms, post-cholecystectomy diarrhea which is typically self-limited, blood clots/ pulmonary embolus, heart attack, pneumonia, stroke, death. Questions were welcomed and answered to patient's satisfaction.  Patient wishes to proceed with surgery.   Phylliss Blakes MD FACS

## 2023-11-04 ENCOUNTER — Other Ambulatory Visit: Payer: Self-pay | Admitting: Gastroenterology

## 2023-11-05 NOTE — Progress Notes (Signed)
 COVID Vaccine received:  []  No [x]  Yes Date of any COVID positive Test in last 90 days: Yes- April 6th PCP - Ambrosio Junker MD Cardiologist -   Chest x-ray - 10/11/23 Epic EKG -   Stress Test -  ECHO -  Cardiac Cath -   Bowel Prep - [x]  No  []   Yes ______  Pacemaker / ICD device [x]  No []  Yes   Spinal Cord Stimulator:[x]  No []  Yes       History of Sleep Apnea? [x]  No []  Yes   CPAP used?- [x]  No []  Yes    Does the patient monitor blood sugar?          [x]  No []  Yes  []  N/A  Patient has: [x]  NO Hx DM   []  Pre-DM                 []  DM1  []   DM2 Does patient have a [employer] or Dexacom? []  No []  Yes   Fasting Blood Sugar Ranges-  Checks Blood Sugar _____ times a day  GLP1 agonist / usual dose - no GLP1 instructions:  SGLT-2 inhibitors / usual dose - no SGLT-2 instructions:   Blood Thinner / Instructions:no Aspirin Instructions:no  Comments:   Activity level: Patient is able  to climb a flight of stairs without difficulty; [x]  No CP  [x]  No SOB,    Patient can perform ADLs without assistance.   Anesthesia review: Polycystic kidney disease, polycystic liver disease.  Patient denies shortness of breath, fever, cough and chest pain at PAT appointment.  Patient verbalized understanding and agreement to the Pre-Surgical Instructions that were given to them at this PAT appointment. Patient was also educated of the need to review these PAT instructions again prior to his/her surgery.I reviewed the appropriate phone numbers to call if they have any and questions or concerns.

## 2023-11-05 NOTE — Patient Instructions (Signed)
 SURGICAL WAITING ROOM VISITATION  Patients having surgery or a procedure may have no more than 2 support people in the waiting area - these visitors may rotate.    Children under the age of 32 must have an adult with them who is not the patient.  Due to an increase in RSV and influenza rates and associated hospitalizations, children ages 67 and under may not visit patients in [facility] hospitals.  Visitors with respiratory illnesses are discouraged from visiting and should remain at home.  If the patient needs to stay at the hospital during part of their recovery, the visitor guidelines for inpatient rooms apply. Pre-op nurse will coordinate an appropriate time for 1 support person to accompany patient in pre-op.  This support person may not rotate.    Please refer to the [facility] website for the visitor guidelines for Inpatients (after your surgery is over and you are in a regular room).       Your procedure is scheduled on: 11/19/23   Report to [facility] Main Entrance    Report to admitting at 11:15 AM   Call this number if you have problems the morning of surgery [phone]   Do not eat food or drink liquids  :After Midnight.But may have sips of water with meds.          Oral Hygiene is also important to reduce your risk of infection.                                    Remember - BRUSH YOUR TEETH THE MORNING OF SURGERY WITH YOUR REGULAR TOOTHPASTE   Stop all vitamins and herbal supplements 7 days before surgery.   Take these medicines the morning of surgery with A SIP OF WATER: famotidine (pepcid ), Hailey Fe, Inderal (propanolol)             You may not have any metal on your body including hair pins, jewelry, and body piercing             Do not wear make-up, lotions, powders, perfumes/cologne, or deodorant  Do not wear nail polish including gel and S&S, artificial/acrylic nails, or any other type of covering on natural nails including finger and toenails.  If you have artificial nails, gel coating, etc. that needs to be removed by a nail salon please have this removed prior to surgery or surgery may need to be canceled/ delayed if the surgeon/ anesthesia feels like they are unable to be safely monitored.   Do not shave  48 hours prior to surgery.    Do not bring valuables to the hospital. [facility] IS NOT             RESPONSIBLE   FOR VALUABLES.   Contacts, glasses, dentures or bridgework may not be worn into surgery.   Bring small overnight bag day of surgery.   DO NOT BRING YOUR HOME MEDICATIONS TO THE HOSPITAL. PHARMACY WILL DISPENSE MEDICATIONS LISTED ON YOUR MEDICATION LIST TO YOU DURING YOUR ADMISSION IN THE HOSPITAL!    Patients discharged on the day of surgery will not be allowed to drive home.  Someone NEEDS to stay with you for the first 24 hours after anesthesia.   Special Instructions: Bring a copy of your healthcare power of attorney and living will documents the day of surgery if you haven't scanned them before.  Please read over the following fact sheets you were given: IF YOU HAVE QUESTIONS ABOUT YOUR PRE-OP INSTRUCTIONS PLEASE CALL [phone] Ammon Bales   If you received a COVID test during your pre-op visit  it is requested that you wear a mask when out in public, stay away from anyone that may not be feeling well and notify your surgeon if you develop symptoms. If you test positive for Covid or have been in contact with anyone that has tested positive in the last 10 days please notify you surgeon.    [facility] - Preparing for Surgery Before surgery, you can play an important role.  Because skin is not sterile, your skin needs to be as free of germs as possible.  You can reduce the number of germs on your skin by washing with CHG (chlorahexidine gluconate) soap before surgery.  CHG is an antiseptic cleaner which kills germs and bonds with the skin to continue killing germs even after washing. Please DO NOT use  if you have an allergy to CHG or antibacterial soaps.  If your skin becomes reddened/irritated stop using the CHG and inform your nurse when you arrive at Short Stay. Do not shave (including legs and underarms) for at least 48 hours prior to the first CHG shower.  You may shave your face/neck.  Please follow these instructions carefully:  1.  Shower with CHG Soap the night before surgery and the  morning of surgery.  2.  If you choose to wash your hair, wash your hair first as usual with your normal  shampoo.  3.  After you shampoo, rinse your hair and body thoroughly to remove the shampoo.                             4.  Use CHG as you would any other liquid soap.  You can apply chg directly to the skin and wash.  Gently with a scrungie or clean washcloth.  5.  Apply the CHG Soap to your body ONLY FROM THE NECK DOWN.   Do   not use on face/ open                           Wound or open sores. Avoid contact with eyes, ears mouth and   genitals (private parts).                       Wash face,  Genitals (private parts) with your normal soap.             6.  Wash thoroughly, paying special attention to the area where your    surgery  will be performed.  7.  Thoroughly rinse your body with warm water from the neck down.  8.  DO NOT shower/wash with your normal soap after using and rinsing off the CHG Soap.                9.  Pat yourself dry with a clean towel.            10.  Wear clean pajamas.            11.  Place clean sheets on your bed the night of your first shower and do not  sleep with pets. Day of Surgery : Do not apply any lotions/deodorants the morning of surgery.  Please wear clean clothes to the hospital/surgery center.  FAILURE TO FOLLOW THESE INSTRUCTIONS MAY RESULT IN THE CANCELLATION OF YOUR SURGERY  PATIENT SIGNATURE_________________________________  NURSE SIGNATURE__________________________________  ________________________________________________________________________

## 2023-11-06 ENCOUNTER — Encounter (HOSPITAL_COMMUNITY)
Admission: RE | Admit: 2023-11-06 | Discharge: 2023-11-06 | Disposition: A | Discharge status: Home / Self Care | Source: Ambulatory Visit | Attending: Surgery | Admitting: Surgery

## 2023-11-06 ENCOUNTER — Encounter (HOSPITAL_COMMUNITY): Payer: Self-pay

## 2023-11-06 ENCOUNTER — Other Ambulatory Visit: Payer: Self-pay

## 2023-11-06 VITALS — BP 152/97 | HR 71 | Temp 99.2°F | Resp 16 | Ht 65.0 in | Wt 132.0 lb

## 2023-11-06 DIAGNOSIS — Z01812 Encounter for preprocedural laboratory examination: Secondary | ICD-10-CM | POA: Insufficient documentation

## 2023-11-06 DIAGNOSIS — Z01818 Encounter for other preprocedural examination: Secondary | ICD-10-CM

## 2023-11-06 HISTORY — DX: Personal history of other diseases of the digestive system: Z87.19

## 2023-11-06 HISTORY — DX: Gastro-esophageal reflux disease without esophagitis: K21.9

## 2023-11-06 HISTORY — DX: Personal history of urinary calculi: Z87.442

## 2023-11-06 HISTORY — DX: Headache, unspecified: R51.9

## 2023-11-06 LAB — CBC
HCT: 33.5 % — ABNORMAL LOW (ref 36.0–46.0)
Hemoglobin: 11.1 g/dL — ABNORMAL LOW (ref 12.0–15.0)
MCH: 30.2 pg (ref 26.0–34.0)
MCHC: 33.1 g/dL (ref 30.0–36.0)
MCV: 91.3 fL (ref 80.0–100.0)
Platelets: 278 10*3/uL (ref 150–400)
RBC: 3.67 MIL/uL — ABNORMAL LOW (ref 3.87–5.11)
RDW: 12.6 % (ref 11.5–15.5)
WBC: 6.3 10*3/uL (ref 4.0–10.5)
nRBC: 0 % (ref 0.0–0.2)

## 2023-11-06 LAB — BASIC METABOLIC PANEL WITH GFR
Anion gap: 8 (ref 5–15)
BUN: 9 mg/dL (ref 6–20)
CO2: 24 mmol/L (ref 22–32)
Calcium: 9.2 mg/dL (ref 8.9–10.3)
Chloride: 105 mmol/L (ref 98–111)
Creatinine, Ser: 0.65 mg/dL (ref 0.44–1.00)
GFR, Estimated: >60 mL/min (ref >60)
Glucose, Bld: 89 mg/dL (ref 70–99)
Potassium: 3.4 mmol/L — ABNORMAL LOW (ref 3.5–5.1)
Sodium: 137 mmol/L (ref 135–145)

## 2023-11-12 ENCOUNTER — Ambulatory Visit: Admitting: Family Medicine

## 2023-11-18 DIAGNOSIS — Z01818 Encounter for other preprocedural examination: Secondary | ICD-10-CM

## 2023-11-19 ENCOUNTER — Ambulatory Visit (HOSPITAL_COMMUNITY): Admitting: Anesthesiology

## 2023-11-19 ENCOUNTER — Encounter (HOSPITAL_COMMUNITY)
Admission: RE | Disposition: A | Discharge status: Home / Self Care | Payer: Self-pay | Source: Home / Self Care | Attending: Surgery

## 2023-11-19 ENCOUNTER — Other Ambulatory Visit: Payer: Self-pay

## 2023-11-19 ENCOUNTER — Encounter (HOSPITAL_COMMUNITY): Payer: Self-pay | Admitting: Surgery

## 2023-11-19 ENCOUNTER — Ambulatory Visit (HOSPITAL_COMMUNITY)
Admission: RE | Admit: 2023-11-19 | Discharge: 2023-11-19 | Disposition: A | Discharge status: Home / Self Care | Attending: Surgery | Admitting: Surgery

## 2023-11-19 DIAGNOSIS — Z79899 Other long term (current) drug therapy: Secondary | ICD-10-CM | POA: Diagnosis not present

## 2023-11-19 DIAGNOSIS — K8010 Calculus of gallbladder with chronic cholecystitis without obstruction: Secondary | ICD-10-CM | POA: Diagnosis not present

## 2023-11-19 DIAGNOSIS — Z01818 Encounter for other preprocedural examination: Secondary | ICD-10-CM

## 2023-11-19 DIAGNOSIS — K219 Gastro-esophageal reflux disease without esophagitis: Secondary | ICD-10-CM | POA: Insufficient documentation

## 2023-11-19 DIAGNOSIS — K449 Diaphragmatic hernia without obstruction or gangrene: Secondary | ICD-10-CM | POA: Diagnosis not present

## 2023-11-19 DIAGNOSIS — K8044 Calculus of bile duct with chronic cholecystitis without obstruction: Secondary | ICD-10-CM | POA: Diagnosis not present

## 2023-11-19 DIAGNOSIS — K8020 Calculus of gallbladder without cholecystitis without obstruction: Secondary | ICD-10-CM | POA: Diagnosis not present

## 2023-11-19 DIAGNOSIS — K8060 Calculus of gallbladder and bile duct with cholecystitis, unspecified, without obstruction: Secondary | ICD-10-CM | POA: Insufficient documentation

## 2023-11-19 DIAGNOSIS — K8050 Calculus of bile duct without cholangitis or cholecystitis without obstruction: Secondary | ICD-10-CM | POA: Diagnosis not present

## 2023-11-19 DIAGNOSIS — Q631 Lobulated, fused and horseshoe kidney: Secondary | ICD-10-CM | POA: Insufficient documentation

## 2023-11-19 HISTORY — PX: CHOLECYSTECTOMY: SHX55

## 2023-11-19 LAB — POCT PREGNANCY, URINE: Preg Test, Ur: NEGATIVE

## 2023-11-19 SURGERY — LAPAROSCOPIC CHOLECYSTECTOMY
Anesthesia: General

## 2023-11-19 MED ORDER — ACETAMINOPHEN 500 MG PO TABS: 1000.0000 mg | ORAL_TABLET | Freq: Once | ORAL | Status: DC

## 2023-11-19 MED ORDER — ESMOLOL HCL 100 MG/10ML IV SOLN
INTRAVENOUS | Status: AC
  Filled 2023-11-19: qty 10

## 2023-11-19 MED ORDER — HYDROMORPHONE HCL 2 MG/ML IJ SOLN
INTRAMUSCULAR | Status: AC
  Filled 2023-11-19: qty 1

## 2023-11-19 MED ORDER — OXYCODONE HCL 5 MG/5ML PO SOLN: 5.0000 mg | Freq: Once | ORAL | Status: AC | PRN

## 2023-11-19 MED ORDER — FENTANYL CITRATE (PF) 100 MCG/2ML IJ SOLN
INTRAMUSCULAR | Status: DC | PRN
  Administered 2023-11-19 (×4): 50 ug via INTRAVENOUS

## 2023-11-19 MED ORDER — OXYCODONE HCL 5 MG PO TABS
5.0000 mg | ORAL_TABLET | Freq: Once | ORAL | Status: AC | PRN
  Administered 2023-11-19: 5 mg via ORAL

## 2023-11-19 MED ORDER — 0.9 % SODIUM CHLORIDE (POUR BTL) OPTIME
TOPICAL | Status: DC | PRN
  Administered 2023-11-19: 1000 mL

## 2023-11-19 MED ORDER — ONDANSETRON HCL 4 MG/2ML IJ SOLN
INTRAMUSCULAR | Status: AC
Start: 2023-11-19 — End: ?
  Filled 2023-11-19: qty 2

## 2023-11-19 MED ORDER — KETAMINE HCL 50 MG/5ML IJ SOSY
PREFILLED_SYRINGE | INTRAMUSCULAR | Status: AC
  Filled 2023-11-19: qty 5

## 2023-11-19 MED ORDER — CEFAZOLIN SODIUM-DEXTROSE 2-4 GM/100ML-% IV SOLN
2.0000 g | INTRAVENOUS | Status: AC
  Administered 2023-11-19: 2 g via INTRAVENOUS
  Filled 2023-11-19: qty 100

## 2023-11-19 MED ORDER — BUPIVACAINE LIPOSOME 1.3 % IJ SUSP: 20.0000 mL | Freq: Once | INTRAMUSCULAR | Status: DC

## 2023-11-19 MED ORDER — TRAMADOL HCL 50 MG PO TABS: 50.0000 mg | ORAL_TABLET | Freq: Four times a day (QID) | ORAL | 0 refills | Status: AC | PRN

## 2023-11-19 MED ORDER — LACTATED RINGERS IR SOLN
Status: DC | PRN
  Administered 2023-11-19: 1000 mL

## 2023-11-19 MED ORDER — CHLORHEXIDINE GLUCONATE 0.12 % MT SOLN
15.0000 mL | Freq: Once | OROMUCOSAL | Status: AC
  Administered 2023-11-19: 15 mL via OROMUCOSAL

## 2023-11-19 MED ORDER — KETOROLAC TROMETHAMINE 30 MG/ML IJ SOLN: 30.0000 mg | Freq: Once | INTRAMUSCULAR | Status: DC | PRN

## 2023-11-19 MED ORDER — CHLORHEXIDINE GLUCONATE 4 % EX SOLN: 60.0000 mL | Freq: Once | CUTANEOUS | Status: DC

## 2023-11-19 MED ORDER — DEXAMETHASONE SODIUM PHOSPHATE 10 MG/ML IJ SOLN
INTRAMUSCULAR | Status: AC
  Filled 2023-11-19: qty 1

## 2023-11-19 MED ORDER — ROCURONIUM BROMIDE 100 MG/10ML IV SOLN
INTRAVENOUS | Status: DC | PRN
  Administered 2023-11-19: 50 mg via INTRAVENOUS

## 2023-11-19 MED ORDER — PROPOFOL 10 MG/ML IV BOLUS
INTRAVENOUS | Status: DC | PRN
  Administered 2023-11-19: 200 mg via INTRAVENOUS

## 2023-11-19 MED ORDER — HYDROMORPHONE HCL 1 MG/ML IJ SOLN
INTRAMUSCULAR | Status: AC
  Filled 2023-11-19: qty 1

## 2023-11-19 MED ORDER — MEPERIDINE HCL 50 MG/ML IJ SOLN: 6.2500 mg | INTRAMUSCULAR | Status: DC | PRN

## 2023-11-19 MED ORDER — HYDROMORPHONE HCL 1 MG/ML IJ SOLN
0.2500 mg | INTRAMUSCULAR | Status: DC | PRN
  Administered 2023-11-19 (×2): 0.5 mg via INTRAVENOUS

## 2023-11-19 MED ORDER — MIDAZOLAM HCL 2 MG/2ML IJ SOLN
INTRAMUSCULAR | Status: AC
  Filled 2023-11-19: qty 2

## 2023-11-19 MED ORDER — LIDOCAINE HCL (CARDIAC) PF 100 MG/5ML IV SOSY
PREFILLED_SYRINGE | INTRAVENOUS | Status: DC | PRN
  Administered 2023-11-19: 100 mg via INTRAVENOUS

## 2023-11-19 MED ORDER — HYDROMORPHONE HCL 1 MG/ML IJ SOLN
INTRAMUSCULAR | Status: DC | PRN
  Administered 2023-11-19: .5 mg via INTRAVENOUS

## 2023-11-19 MED ORDER — ONDANSETRON HCL 4 MG/2ML IJ SOLN
INTRAMUSCULAR | Status: DC | PRN
  Administered 2023-11-19: 4 mg via INTRAVENOUS

## 2023-11-19 MED ORDER — ROCURONIUM BROMIDE 10 MG/ML (PF) SYRINGE
PREFILLED_SYRINGE | INTRAVENOUS | Status: AC
  Filled 2023-11-19: qty 10

## 2023-11-19 MED ORDER — LACTATED RINGERS IV SOLN: INTRAVENOUS | Status: DC

## 2023-11-19 MED ORDER — AMISULPRIDE (ANTIEMETIC) 5 MG/2ML IV SOLN
INTRAVENOUS | Status: AC
  Filled 2023-11-19: qty 4

## 2023-11-19 MED ORDER — BUPIVACAINE-EPINEPHRINE (PF) 0.25% -1:200000 IJ SOLN
INTRAMUSCULAR | Status: AC
  Filled 2023-11-19: qty 30

## 2023-11-19 MED ORDER — SUGAMMADEX SODIUM 200 MG/2ML IV SOLN
INTRAVENOUS | Status: DC | PRN
  Administered 2023-11-19: 120 mg via INTRAVENOUS

## 2023-11-19 MED ORDER — KETOROLAC TROMETHAMINE 30 MG/ML IJ SOLN
INTRAMUSCULAR | Status: AC
  Filled 2023-11-19: qty 1

## 2023-11-19 MED ORDER — SCOPOLAMINE 1 MG/3DAYS TD PT72
1.0000 | MEDICATED_PATCH | Freq: Once | TRANSDERMAL | Status: DC
  Administered 2023-11-19: 1.5 mg via TRANSDERMAL
  Filled 2023-11-19: qty 1

## 2023-11-19 MED ORDER — MIDAZOLAM HCL 5 MG/5ML IJ SOLN
INTRAMUSCULAR | Status: DC | PRN
  Administered 2023-11-19: 2 mg via INTRAVENOUS

## 2023-11-19 MED ORDER — INDOCYANINE GREEN 25 MG IV SOLR
1.2500 mg | Freq: Once | INTRAVENOUS | Status: AC
  Administered 2023-11-19: 1.25 mg via INTRAVENOUS
  Filled 2023-11-19: qty 10

## 2023-11-19 MED ORDER — AMISULPRIDE (ANTIEMETIC) 5 MG/2ML IV SOLN
10.0000 mg | Freq: Once | INTRAVENOUS | Status: AC | PRN
  Administered 2023-11-19: 10 mg via INTRAVENOUS

## 2023-11-19 MED ORDER — FENTANYL CITRATE (PF) 100 MCG/2ML IJ SOLN
INTRAMUSCULAR | Status: AC
  Filled 2023-11-19: qty 2

## 2023-11-19 MED ORDER — PROPOFOL 10 MG/ML IV BOLUS
INTRAVENOUS | Status: AC
  Filled 2023-11-19: qty 20

## 2023-11-19 MED ORDER — KETAMINE HCL 50 MG/5ML IJ SOSY
PREFILLED_SYRINGE | INTRAMUSCULAR | Status: DC | PRN
  Administered 2023-11-19 (×2): 15 mg via INTRAVENOUS

## 2023-11-19 MED ORDER — DEXMEDETOMIDINE HCL IN NACL 80 MCG/20ML IV SOLN
INTRAVENOUS | Status: AC
  Filled 2023-11-19: qty 20

## 2023-11-19 MED ORDER — BUPIVACAINE-EPINEPHRINE 0.25% -1:200000 IJ SOLN
INTRAMUSCULAR | Status: DC | PRN
  Administered 2023-11-19: 30 mL

## 2023-11-19 MED ORDER — OXYCODONE HCL 5 MG PO TABS
ORAL_TABLET | ORAL | Status: AC
  Filled 2023-11-19: qty 1

## 2023-11-19 MED ORDER — ESMOLOL HCL 100 MG/10ML IV SOLN
INTRAVENOUS | Status: DC | PRN
  Administered 2023-11-19: 20 mg via INTRAVENOUS

## 2023-11-19 MED ORDER — DEXMEDETOMIDINE HCL IN NACL 80 MCG/20ML IV SOLN
INTRAVENOUS | Status: DC | PRN
  Administered 2023-11-19: 8 ug via INTRAVENOUS

## 2023-11-19 MED ORDER — GABAPENTIN 300 MG PO CAPS
300.0000 mg | ORAL_CAPSULE | ORAL | Status: AC
  Administered 2023-11-19: 300 mg via ORAL
  Filled 2023-11-19: qty 1

## 2023-11-19 MED ORDER — ORAL CARE MOUTH RINSE: 15.0000 mL | Freq: Once | OROMUCOSAL | Status: AC

## 2023-11-19 MED ORDER — DEXAMETHASONE SODIUM PHOSPHATE 10 MG/ML IJ SOLN
INTRAMUSCULAR | Status: DC | PRN
Start: 2023-11-19 — End: 2023-11-19
  Administered 2023-11-19: 8 mg via INTRAVENOUS

## 2023-11-19 MED ORDER — ONDANSETRON HCL 4 MG/2ML IJ SOLN: 4.0000 mg | Freq: Once | INTRAMUSCULAR | Status: DC | PRN

## 2023-11-19 MED ORDER — ACETAMINOPHEN 500 MG PO TABS
1000.0000 mg | ORAL_TABLET | ORAL | Status: AC
  Administered 2023-11-19: 1000 mg via ORAL
  Filled 2023-11-19: qty 2

## 2023-11-19 MED ORDER — BUPIVACAINE LIPOSOME 1.3 % IJ SUSP
INTRAMUSCULAR | Status: AC
  Filled 2023-11-19: qty 20

## 2023-11-19 MED ORDER — PROPOFOL 1000 MG/100ML IV EMUL
INTRAVENOUS | Status: AC
  Filled 2023-11-19: qty 100

## 2023-11-19 MED ORDER — LIDOCAINE HCL (PF) 2 % IJ SOLN
INTRAMUSCULAR | Status: AC
  Filled 2023-11-19: qty 5

## 2023-11-19 MED ORDER — PROPOFOL 500 MG/50ML IV EMUL
INTRAVENOUS | Status: DC | PRN
Start: 2023-11-19 — End: 2023-11-19
  Administered 2023-11-19: 75 ug/kg/min via INTRAVENOUS

## 2023-11-19 MED ORDER — DOCUSATE SODIUM 100 MG PO CAPS: 100.0000 mg | ORAL_CAPSULE | Freq: Two times a day (BID) | ORAL | 0 refills | Status: AC

## 2023-11-19 SURGICAL SUPPLY — 36 items
BAG COUNTER SPONGE SURGICOUNT (BAG) ×1 IMPLANT
BENZOIN TINCTURE PRP APPL 2/3 (GAUZE/BANDAGES/DRESSINGS) IMPLANT
BNDG ADH 1X3 SHEER STRL LF (GAUZE/BANDAGES/DRESSINGS) IMPLANT
CABLE HIGH FREQUENCY MONO STRZ (ELECTRODE) ×1 IMPLANT
CHLORAPREP W/TINT 26 (MISCELLANEOUS) ×1 IMPLANT
CLIP APPLIE ROT 10 11.4 M/L (STAPLE) ×1 IMPLANT
COVER MAYO STAND XLG (MISCELLANEOUS) IMPLANT
COVER SURGICAL LIGHT HANDLE (MISCELLANEOUS) ×1 IMPLANT
DERMABOND ADVANCED .7 DNX12 (GAUZE/BANDAGES/DRESSINGS) IMPLANT
DRAPE C-ARM 42X120 X-RAY (DRAPES) IMPLANT
ELECT REM PT RETURN 15FT ADLT (MISCELLANEOUS) ×1 IMPLANT
ENDOLOOP SUT PDS II 0 18 (SUTURE) IMPLANT
GLOVE BIO SURGEON STRL SZ 6 (GLOVE) ×1 IMPLANT
GLOVE INDICATOR 6.5 STRL GRN (GLOVE) ×1 IMPLANT
GOWN STRL REUS W/ TWL LRG LVL3 (GOWN DISPOSABLE) ×1 IMPLANT
GRASPER SUT TROCAR 14GX15 (MISCELLANEOUS) ×1 IMPLANT
HEMOSTAT SNOW SURGICEL 2X4 (HEMOSTASIS) IMPLANT
IRRIGATION SUCT STRKRFLW 2 WTP (MISCELLANEOUS) ×1 IMPLANT
KIT BASIN OR (CUSTOM PROCEDURE TRAY) ×1 IMPLANT
KIT TURNOVER KIT A (KITS) IMPLANT
NDL INSUFFLATION 14GA 120MM (NEEDLE) ×1 IMPLANT
NEEDLE INSUFFLATION 14GA 120MM (NEEDLE) ×1 IMPLANT
SCISSORS LAP 5X35 DISP (ENDOMECHANICALS) ×1 IMPLANT
SET CHOLANGIOGRAPH MIX (MISCELLANEOUS) IMPLANT
SET TUBE SMOKE EVAC HIGH FLOW (TUBING) ×1 IMPLANT
SLEEVE Z-THREAD 5X100MM (TROCAR) ×1 IMPLANT
SPIKE FLUID TRANSFER (MISCELLANEOUS) ×1 IMPLANT
STRIP CLOSURE SKIN 1/2X4 (GAUZE/BANDAGES/DRESSINGS) IMPLANT
SUT MNCRL AB 4-0 PS2 18 (SUTURE) ×1 IMPLANT
SUT VICRYL 0 TIES 12 18 (SUTURE) IMPLANT
SYSTEM BAG RETRIEVAL 10MM (BASKET) IMPLANT
TOWEL OR 17X26 10 PK STRL BLUE (TOWEL DISPOSABLE) ×1 IMPLANT
TRAY LAPAROSCOPIC (CUSTOM PROCEDURE TRAY) ×1 IMPLANT
TROCAR ADV FIXATION 12X100MM (TROCAR) ×1 IMPLANT
TROCAR XCEL NON-BLD 5MMX100MML (ENDOMECHANICALS) IMPLANT
TROCAR Z-THREAD OPTICAL 5X100M (TROCAR) ×1 IMPLANT

## 2023-11-19 NOTE — H&P (Signed)
 Alison Ballard [id number]   Referring Provider:  Meridee Score, MD   Subjective   Chief Complaint: New Consultation     History of Present Illness:    Very pleasant 23 year old woman with history of kidney stones, polycystic kidney disease and polycystic liver disease, ovarian cyst but no previous abdominal surgery who presents for evaluation of possible biliary colic.  She was evaluated in the emergency department on 3/30 with right flank and epigastric pain that have been going on for about 6 hours at the time.  This was fairly intense, aggravated by movement.  She reported a similar episode a few years prior and had been diagnosed with a kidney stone.  No associated fever, nausea, vomiting.  She underwent a CT renal stone protocol demonstrating a nonobstructing 4 mm right kidney stone, innumerable bilateral kidney cysts as well as innumerable liver cysts, contracted gallbladder with no calcified gallstones or pericholecystic inflammatory changes.  Subsequently also had a right upper quadrant ultrasound which states that the gallbladder is not distinctly visualized but there is a wall echo shadow complex seen compatible with gallbladder completely filled with multiple shadowing gallstones and sludge, no abnormal wall thickening or pericholecystic fluid, negative sonographic Murphy sign, common bile duct 5 mm  Labs including CMP and CBC unremarkable, she did have small hemoglobin in her urine   She notes that she has had episodes of similar pain in the past, in addition to which she has had substernal/epigastric pain related to eating specifically greasy foods.  Occasionally associate with nausea but no emesis.  She does report a history of hiatal hernia and had chalked her symptoms mostly up to that.  She does have some indigestion which is improved with Prevacid.  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   Review of Systems: A complete review of systems was obtained from  the patient.  I have reviewed this information and discussed as appropriate with the patient.  See HPI as well for other ROS.   Medical History: Past Medical History:  Diagnosis Date   Chronic kidney disease    GERD (gastroesophageal reflux disease)    Liver disease     There is no problem list on file for this patient.   Past Surgical History:  Procedure Laterality Date   TONSILLECTOMY       No Known Allergies  Current Outpatient Medications on File Prior to Visit  Medication Sig Dispense Refill   famotidine (PEPCID) 40 MG tablet Take 40 mg by mouth once daily     HAILEY 24 FE 1 mg-20 mcg (24)/75 mg (4) tablet Take 1 tablet by mouth once daily     propranoloL (INDERAL LA) 60 MG LA capsule Take 60 mg by mouth     QULIPTA 60 mg Tab Take 60 mg by mouth     No current facility-administered medications on file prior to visit.    Family History  Problem Relation Age of Onset   High blood pressure (Hypertension) Mother    Hyperlipidemia (Elevated cholesterol) Father      Social History   Tobacco Use  Smoking Status Never  Smokeless Tobacco Never     Social History   Socioeconomic History   Marital status: Single  Tobacco Use   Smoking status: Never   Smokeless tobacco: Never  Substance and Sexual Activity   Alcohol use: Never   Drug use: Never   Social Drivers of Health   Housing Stability: Unknown (10/12/2023)   Housing Stability Vital Sign  Homeless in the Last Year: No    Objective:    Vitals:   10/12/23 1105  BP: 129/86  Pulse: (!) 118  Temp: 36.7 C (98 F)  SpO2: 98%  Weight: 58.9 kg (129 lb 12.8 oz)  Height: 165.1 cm (5\' 5" )  PainSc: 0-No pain    Body mass index is 21.6 kg/m.  Gen: A&Ox3, no distress  Chest: respiratory effort is normal. Abdomen: soft, nondistended, nontender.  Neuro: no gross deficit Psych: appropriate mood and affect, normal insight/judgment intact  Skin: warm and dry     Assessment and Plan:  Diagnoses and  all orders for this visit:  Biliary colic    Imaging with a contracted, stone filled gallbladder.  We discussed that her epigastric pain and nausea associate with greasy foods may actually be more biliary colic than related to the hiatal hernia that she reports.  She did have a kidney stone on her imaging but this was not obstructing and unlikely to have been causing the right sided pain although this pain was a bit more posterior than I would expect for gallbladder pain.  Overall given constellation of symptoms and imaging findings, I do think it would be reasonable to proceed with laparoscopic cholecystectomy. Discussed the relevant anatomy using a diagram to demonstrate, and went over surgical technique.  Discussed risks of surgery including bleeding, infection, pain, scarring, intraabdominal injury specifically to the common bile duct and sequelae, subtotal cholecystectomy, bile leak, conversion to open surgery, failure to resolve symptoms, post-cholecystectomy diarrhea which is typically self-limited, blood clots/ pulmonary embolus, heart attack, pneumonia, stroke, death. Questions were welcomed and answered to patient's satisfaction.  Patient wishes to proceed with surgery.   Phylliss Blakes MD FACS

## 2023-11-19 NOTE — Anesthesia Preprocedure Evaluation (Addendum)
 Anesthesia Evaluation  Patient identified by MRN, date of birth, ID band Patient awake    Reviewed: Allergy & Precautions, NPO status , Patient's Chart, lab work & pertinent test results  Airway Mallampati: II  TM Distance: >3 FB Neck ROM: Full    Dental  (+) Teeth Intact, Dental Advisory Given   Pulmonary neg pulmonary ROS   Pulmonary exam normal breath sounds clear to auscultation       Cardiovascular negative cardio ROS Normal cardiovascular exam Rhythm:Regular Rate:Normal     Neuro/Psych  Headaches (propanolol- last took last week)  negative psych ROS   GI/Hepatic Neg liver ROS, hiatal hernia,GERD  Controlled and Medicated,,  Endo/Other  negative endocrine ROS    Renal/GU negative Renal ROS  negative genitourinary   Musculoskeletal negative musculoskeletal ROS (+)    Abdominal   Peds  Hematology  (+) Blood dyscrasia, anemia Hb 11.1, plt 278   Anesthesia Other Findings   Reproductive/Obstetrics                             Anesthesia Physical Anesthesia Plan  ASA: 2  Anesthesia Plan: General   Post-op Pain Management: Tylenol PO (pre-op)*, Toradol IV (intra-op)*, Ketamine IV*, Dilaudid IV and Precedex   Induction: Intravenous  PONV Risk Score and Plan: 4 or greater and Ondansetron , Dexamethasone, Midazolam, Scopolamine patch - Pre-op and Treatment may vary due to age or medical condition  Airway Management Planned: Oral ETT  Additional Equipment: None  Intra-op Plan:   Post-operative Plan: Extubation in OR  Informed Consent: I have reviewed the patients History and Physical, chart, labs and discussed the procedure including the risks, benefits and alternatives for the proposed anesthesia with the patient or authorized representative who has indicated his/her understanding and acceptance.     Dental advisory given  Plan Discussed with: CRNA  Anesthesia Plan Comments:         Anesthesia Quick Evaluation

## 2023-11-19 NOTE — Op Note (Signed)
 Operative Note  Alison Ballard 24 y.o. female [id number]  11/19/2023  Surgeon: Adalberto Acton MD FACS  Assistant: Shela Derby MD FACS  Procedure performed: Laparoscopic Cholecystectomy with near infrared fluorescent cholangiography  Preop diagnosis: biliary colic Post-op diagnosis/intraop findings: same  Specimens: gallbladder  Retained items: none  EBL: minimal  Complications: none  Description of procedure: After obtaining informed consent the patient was brought to the operating room. Antibiotics were administered. SCD's were applied. General endotracheal anesthesia was initiated and a formal time-out was performed. The abdomen was prepped and draped in the usual sterile fashion and the abdomen was entered using an infraumbilical veress needle after instilling the site with local. Insufflation to was obtained, 5mm trocar and camera inserted, and gross inspection revealed no evidence of injury from our entry or other intraabdominal abnormalities. Two 5mm trocars were introduced in the right midclavicular and right anterior axillary lines under direct visualization and following infiltration with local. A 12mm trocar was placed in the epigastrium. The gallbladder fundus was retracted cephalad and the infundibulum was retracted laterally. A combination of hook electrocautery and blunt dissection was utilized to clear the peritoneum from the neck and cystic duct, circumferentially isolating the cystic artery and cystic duct and lifting the gallbladder from the cystic plate. The critical view of safety was achieved with the cystic artery, cystic duct, and liver bed visualized between them with no other structures.  Near infrared fluorescent cholangiography was then performed demonstrating concordant anatomy and also illuminating the common hepatic and common bile duct medially confirming that these are well away from the area of dissection.  The cystic artery was clipped with a single  clip proximally and distally and divided as was the cystic duct with three clips on the proximal end. The gallbladder was dissected from the liver plate using electrocautery.  A small posterior branch vessel entering the gallbladder along the gallbladder body was clipped proximally before dividing distally along the gallbladder wall with cautery once freed the gallbladder was placed in an endocatch bag and removed intact through the epigastric trocar site. A small amount of bleeding on the liver bed was controlled with cautery. The right upper quadrant was irrigated with warm sterile saline; the effluent was clear. Hemostasis was once again confirmed, and reinspection of the abdomen revealed no injuries. The clips were well apposed without any bile leak from the ligated cystic duct remnant or the liver bed either on direct view nor with near-infrared fluorescent cholangiography. The 12mm trocar site in the epigastrium was closed with a 0 vicryl in the fascia under direct visualization using a PMI device. The abdomen was desufflated and all trocars removed. The skin incisions were closed with subcuticular 4-0 monocryl and Dermabond. The patient was awakened, extubated and transported to the recovery room in stable condition.    All counts were correct at the completion of the case.

## 2023-11-19 NOTE — Anesthesia Procedure Notes (Addendum)
 Procedure Name: Intubation Date/Time: 11/19/2023 12:07 PM  Performed by: Elaina Graver, CRNAPre-anesthesia Checklist: Patient identified, Emergency Drugs available, Suction available and Patient being monitored Patient Re-evaluated:Patient Re-evaluated prior to induction Oxygen Delivery Method: Circle System Utilized Preoxygenation: Pre-oxygenation with 100% oxygen Induction Type: IV induction Ventilation: Mask ventilation without difficulty Laryngoscope Size: Mac and 3 Grade View: Grade II Tube type: Oral Tube size: 7.0 mm Number of attempts: 1 Airway Equipment and Method: Stylet Placement Confirmation: ETT inserted through vocal cords under direct vision, positive ETCO2 and breath sounds checked- equal and bilateral Secured at: 22 cm Tube secured with: Tape Dental Injury: Teeth and Oropharynx as per pre-operative assessment  Comments: Intubation by Tyra Galley (EMT student) x1

## 2023-11-19 NOTE — Anesthesia Postprocedure Evaluation (Signed)
 Anesthesia Post Note  Patient: Alison Ballard  Procedure(s) Performed: LAPAROSCOPIC CHOLECYSTECTOMY     Patient location during evaluation: PACU Anesthesia Type: General Level of consciousness: awake and alert, oriented and patient cooperative Pain management: pain level controlled Vital Signs Assessment: post-procedure vital signs reviewed and stable Respiratory status: spontaneous breathing, nonlabored ventilation and respiratory function stable Cardiovascular status: blood pressure returned to baseline and stable Postop Assessment: no apparent nausea or vomiting Anesthetic complications: no   No notable events documented.  Last Vitals:  Vitals:   11/19/23 1315 11/19/23 1330  BP: (!) 144/97 (!) 147/98  Pulse: 83 80  Resp: 18 11  Temp:  36.4 C  SpO2: 99% 100%    Last Pain:  Vitals:   11/19/23 1330  TempSrc:   PainSc: 5                  Jacquelyne Matte

## 2023-11-19 NOTE — Discharge Instructions (Signed)
LAPAROSCOPIC SURGERY: POST OP INSTRUCTIONS   EAT Gradually transition to a high fiber diet with a fiber supplement over the next few weeks after discharge.  Start with a pureed / full liquid diet (see below)  WALK Walk an hour a day (cumulative- not all at once).  Control your pain to do that.    CONTROL PAIN Control pain so that you can walk, sleep, tolerate sneezing/coughing, go up/down stairs.  HAVE A BOWEL MOVEMENT DAILY Keep your bowels regular to avoid problems.  OK to try a laxative to override constipation.  OK to use an antidiarrheal to slow down diarrhea.  Call if not better after 2 tries  CALL IF YOU HAVE PROBLEMS/CONCERNS Call if you are still struggling despite following these instructions. Call if you have concerns not answered by these instructions    DIET: Follow a light bland diet & liquids the first 24 hours after arrival home, such as soup, liquids, starches, etc.  Be sure to drink plenty of fluids.  Quickly advance to a usual solid diet within a few days.  Avoid fast food or heavy meals initially as you are more likely to get nauseated or have irregular bowels.    Take your usually prescribed home medications unless otherwise directed.  PAIN CONTROL: Pain is best controlled by a usual combination of three different methods TOGETHER: Ice/Heat Over the counter pain medication Prescription pain medication Most patients will experience some swelling and bruising around the incisions.  Ice packs or heating pads (30-60 minutes up to 6 times a day) will help. Use ice for the first few days to help decrease swelling and bruising, then switch to heat to help relax tight/sore spots and speed recovery.  Some people prefer to use ice alone, heat alone, alternating between ice & heat.  Experiment to what works for you.  Swelling and bruising can take several weeks to resolve.   It is helpful to take an over-the-counter pain medication regularly for the first few days: Naproxen  (Aleve, etc)  Two 220mg tabs twice a day OR Ibuprofen (Advil, etc) Three 200mg tabs four times a day (every meal & bedtime) AND Acetaminophen (Tylenol, etc) 500-650mg four times a day (every meal & bedtime) A  prescription for pain medication (such as oxycodone, hydrocodone, tramadol, gabapentin, methocarbamol, etc) should be given to you upon discharge.  Take your pain medication as prescribed, IF NEEDED.  If you are having problems/concerns with the prescription medicine (does not control pain, nausea, vomiting, rash, itching, etc), please call us [phone] to see if we need to switch you to a different pain medicine that will work better for you and/or control your side effect better. If you need a refill on your pain medication, please give us 48 hour notice.  contact your pharmacy.  They will contact our office to request authorization. Prescriptions will not be filled after 5 pm or on week-ends  Avoid getting constipated.   Between the surgery and the pain medications, it is common to experience some constipation.   Increasing fluid intake and taking a fiber supplement (such as Metamucil, Citrucel, FiberCon, MiraLax, etc) 1-2 times a day regularly will usually help prevent this problem from occurring.   A mild laxative (prune juice, Milk of Magnesia, MiraLax, etc) should be taken according to package directions if there are no bowel movements after 48 hours.   Watch out for diarrhea.   If you have many loose bowel movements, simplify your diet to bland foods & liquids   for a few days.   Stop any stool softeners and decrease your fiber supplement.   Switching to mild anti-diarrheal medications (Kayopectate, Pepto Bismol) can help.   If this worsens or does not improve, please call us.  Wash / shower every day.  You may shower over the skin glue which is waterproof.  Do not soak or submerge incisions.  No rubbing, scrubbing, lotions or ointments to incisions.  Glue will flake off after  about 2 weeks.  You may leave the incision open to air.  You may replace a dressing/Band-Aid to cover the incision for comfort if you wish.   ACTIVITIES as tolerated:   You may resume regular (light) daily activities beginning the next day--such as daily self-care, walking, climbing stairs--gradually increasing activities as tolerated.  If you can walk 30 minutes without difficulty, it is safe to try more intense activity such as jogging, treadmill, bicycling, low-impact aerobics, swimming, etc. Save the most intensive and strenuous activity for last such as sit-ups, heavy lifting, contact sports, etc  Refrain from any heavy lifting or straining until you are off narcotics for pain control.   DO NOT PUSH THROUGH PAIN.  Let pain be your guide: If it hurts to do something, don't do it.  Pain is your body warning you to avoid that activity for another week until the pain goes down. You may drive when you are no longer taking prescription pain medication, you can comfortably wear a seatbelt, and you can safely maneuver your car and apply brakes. You may have sexual intercourse when it is comfortable.  FOLLOW UP in our office Please call [facility] at [phone] to set up an appointment to see your surgeon in the office for a follow-up appointment approximately 2-3 weeks after your surgery. Make sure that you call for this appointment the day you arrive home to insure a convenient appointment time.  10. IF YOU HAVE DISABILITY OR FAMILY LEAVE FORMS, BRING THEM TO THE OFFICE FOR PROCESSING.  DO NOT GIVE THEM TO YOUR DOCTOR.   WHEN TO CALL US [phone]: Poor pain control Reactions / problems with new medications (rash/itching, nausea, etc)  Fever over 101.5 F (38.5 C) Inability to urinate Nausea and/or vomiting Worsening swelling or bruising Continued bleeding from incision. Increased pain, redness, or drainage from the incision   The clinic staff is available to answer your questions during  regular business hours (8:30am-5pm).  Please don't hesitate to call and ask to speak to one of our nurses for clinical concerns.   If you have a medical emergency, go to the nearest emergency room or call 911.  A surgeon from [facility] is always on call at the hospitals   [facility] [address], [address], [place], [place]  [address] ? MAIN: [phone] ? TOLL FREE: [phone] ?  FAX [phone] [email or web address]  

## 2023-11-19 NOTE — Transfer of Care (Signed)
 Immediate Anesthesia Transfer of Care Note  Patient: Alison Ballard  Procedure(s) Performed: LAPAROSCOPIC CHOLECYSTECTOMY  Patient Location: PACU  Anesthesia Type:General  Level of Consciousness: awake, alert , and patient cooperative  Airway & Oxygen Therapy: Patient Spontanous Breathing and Patient connected to face mask oxygen  Post-op Assessment: Report given to RN and Post -op Vital signs reviewed and stable  Post vital signs: Reviewed and stable  Last Vitals:  Vitals Value Taken Time  BP 155/106 11/19/23 1259  Temp    Pulse 111 11/19/23 1259  Resp 12 11/19/23 1259  SpO2 100 % 11/19/23 1259  Vitals shown include unfiled device data.  Last Pain:  Vitals:   11/19/23 1041  TempSrc:   PainSc: 0-No pain         Complications: No notable events documented.

## 2023-11-20 ENCOUNTER — Encounter (HOSPITAL_COMMUNITY): Payer: Self-pay | Admitting: Surgery

## 2023-11-20 LAB — SURGICAL PATHOLOGY

## 2024-01-01 ENCOUNTER — Encounter: Payer: Self-pay | Admitting: Family Medicine

## 2024-01-10 ENCOUNTER — Ambulatory Visit: Payer: Self-pay | Admitting: Family Medicine

## 2024-01-21 ENCOUNTER — Ambulatory Visit (INDEPENDENT_AMBULATORY_CARE_PROVIDER_SITE_OTHER): Admitting: Family Medicine

## 2024-01-21 VITALS — BP 150/101 | HR 76 | Ht 65.0 in | Wt 137.0 lb

## 2024-01-21 DIAGNOSIS — R319 Hematuria, unspecified: Secondary | ICD-10-CM | POA: Diagnosis not present

## 2024-01-21 DIAGNOSIS — I10 Essential (primary) hypertension: Secondary | ICD-10-CM | POA: Insufficient documentation

## 2024-01-21 DIAGNOSIS — R5383 Other fatigue: Secondary | ICD-10-CM | POA: Diagnosis not present

## 2024-01-21 MED ORDER — AMLODIPINE BESYLATE 5 MG PO TABS: 5.0000 mg | ORAL_TABLET | Freq: Every day | ORAL | 3 refills | Status: DC

## 2024-01-21 NOTE — Assessment & Plan Note (Signed)
 History of elevated blood pressure for 2-3 years, notably during a recent cholecystectomy. Family history of hypertension in mother. Has a history of polycystic kidney disease and PCOS. Believes the elevated blood pressure is likely secondary to polycystic kidney disease. - Start lisinopril. - Order renin-aldosterone ratio blood test. - Check blood pressure at home for two weeks and return log to the office. - Follow up in one month. May adjust medications via message before then based on lab results. - Re-establish care with nephrology for monitoring of polycystic kidney disease and consideration of a renal artery stenosis ultrasound.

## 2024-01-21 NOTE — Patient Instructions (Signed)
 It was nice to see you today,  We addressed the following topics today: -I am sending in a medication called amlodipine  that you will take once a day. - I am ordering some tests and after those results come back I may change or added an additional blood pressure medication - You should reach out to your nephrologist for routine monitoring of your PKD - Check your blood pressure at home twice a day for the next 2 weeks and bring the results back to us .  Have a great day,  Rolan Slain, MD

## 2024-01-21 NOTE — Progress Notes (Unsigned)
   Established Patient Office Visit  Subjective   Patient ID: Alison Ballard, female    DOB: 09-24-2000  Age: 23 y.o. MRN: [id number]  Chief Complaint  Patient presents with   Hypertension    HPI  Subjective - Here for elevated blood pressure. - Reports blood pressure has been high for the last 2-3 years, initially attributed to anxiety and PCOS. - Blood pressure was persistently high during gallbladder surgery in May. - Reports anxiety.  PMH, PSH, FH, Social Hx PMH: Polycystic kidney disease, PCOS, anxiety. PSH: Cholecystectomy (May). FH: Mother has high blood pressure. Social Hx: Denies pregnancy.  ROS Pertinent positives: elevated blood pressure, anxiety.    The ASCVD Risk score (Arnett DK, et al., 2019) failed to calculate for the following reasons:   The 2019 ASCVD risk score is only valid for ages 51 to 77  Health Maintenance Due  Topic Date Due   HPV VACCINES (1 - 3-dose series) Never done   HIV Screening  Never done   Meningococcal B Vaccine (1 of 2 - Standard) 05/21/2019   Hepatitis C Screening  Never done   COVID-19 Vaccine (2 - 2024-25 season) 03/11/2023   Cervical Cancer Screening (Pap smear)  Never done      Objective:     BP (!) 150/101   Pulse 76   Ht 5' 5 (1.651 m)   Wt 137 lb (62.1 kg)   LMP 01/04/2024   SpO2 100%   BMI 22.80 kg/m  {Vitals History (Optional):23777}  Physical Exam Gen: alert, oriented Cv: rrr Pulm: lctab   No results found for any visits on 01/21/24.      Assessment & Plan:   Severe hypertension Assessment & Plan: History of elevated blood pressure for 2-3 years, notably during a recent cholecystectomy. Family history of hypertension in mother. Has a history of polycystic kidney disease and PCOS. Believes the elevated blood pressure is likely secondary to polycystic kidney disease. - Start lisinopril. - Order renin-aldosterone ratio blood test. - Check blood pressure at home for two weeks and return log to the  office. - Follow up in one month. May adjust medications via message before then based on lab results. - Re-establish care with nephrology for monitoring of polycystic kidney disease and consideration of a renal artery stenosis ultrasound.  Orders: -     Aldosterone + renin activity w/ ratio; Future  Other orders -     amLODIPine  Besylate; Take 1 tablet (5 mg total) by mouth daily.  Dispense: 90 tablet; Refill: 3     Return in about 6 weeks (around 03/03/2024) for HTN.    Toribio MARLA Slain, MD

## 2024-01-23 LAB — COMPREHENSIVE METABOLIC PANEL WITH GFR
ALT: 12 IU/L (ref 0–32)
AST: 18 IU/L (ref 0–40)
Albumin: 4.2 g/dL (ref 4.0–5.0)
Alkaline Phosphatase: 41 IU/L — ABNORMAL LOW (ref 44–121)
BUN/Creatinine Ratio: 11 (ref 9–23)
BUN: 9 mg/dL (ref 6–20)
Bilirubin Total: 0.3 mg/dL (ref 0.0–1.2)
CO2: 17 mmol/L — ABNORMAL LOW (ref 20–29)
Calcium: 9.5 mg/dL (ref 8.7–10.2)
Chloride: 101 mmol/L (ref 96–106)
Creatinine, Ser: 0.8 mg/dL (ref 0.57–1.00)
Globulin, Total: 2.9 g/dL (ref 1.5–4.5)
Glucose: 83 mg/dL (ref 70–99)
Potassium: 3.9 mmol/L (ref 3.5–5.2)
Sodium: 136 mmol/L (ref 134–144)
Total Protein: 7.1 g/dL (ref 6.0–8.5)
eGFR: 107 mL/min/1.73 (ref >59)

## 2024-01-23 LAB — CBC WITH DIFFERENTIAL/PLATELET
Basophils Absolute: 0 x10E3/uL (ref 0.0–0.2)
Basos: 0 %
EOS (ABSOLUTE): 0 x10E3/uL (ref 0.0–0.4)
Eos: 0 %
Hematocrit: 36.5 % (ref 34.0–46.6)
Hemoglobin: 12.4 g/dL (ref 11.1–15.9)
Immature Grans (Abs): 0 x10E3/uL (ref 0.0–0.1)
Immature Granulocytes: 0 %
Lymphocytes Absolute: 1.9 x10E3/uL (ref 0.7–3.1)
Lymphs: 37 %
MCH: 30.8 pg (ref 26.6–33.0)
MCHC: 34 g/dL (ref 31.5–35.7)
MCV: 91 fL (ref 79–97)
Monocytes Absolute: 0.3 x10E3/uL (ref 0.1–0.9)
Monocytes: 6 %
Neutrophils Absolute: 2.9 x10E3/uL (ref 1.4–7.0)
Neutrophils: 57 %
Platelets: 313 x10E3/uL (ref 150–450)
RBC: 4.03 x10E6/uL (ref 3.77–5.28)
RDW: 13.1 % (ref 11.7–15.4)
WBC: 5.1 x10E3/uL (ref 3.4–10.8)

## 2024-01-23 LAB — LIPID PANEL
Chol/HDL Ratio: 2.7 ratio (ref 0.0–4.4)
Cholesterol, Total: 162 mg/dL (ref 100–199)
HDL: 59 mg/dL (ref >39)
LDL Chol Calc (NIH): 76 mg/dL (ref 0–99)
Triglycerides: 159 mg/dL — ABNORMAL HIGH (ref 0–149)
VLDL Cholesterol Cal: 27 mg/dL (ref 5–40)

## 2024-01-23 LAB — SPECIMEN STATUS REPORT

## 2024-01-23 LAB — TSH: TSH: 0.877 u[IU]/mL (ref 0.450–4.500)

## 2024-01-24 ENCOUNTER — Ambulatory Visit: Payer: Self-pay | Admitting: Family Medicine

## 2024-01-29 LAB — ALDOSTERONE + RENIN ACTIVITY W/ RATIO
Aldos/Renin Ratio: 4.3 (ref 0.0–30.0)
Aldosterone: 12.4 ng/dL (ref 0.0–30.0)
Renin Activity, Plasma: 2.905 ng/mL/h (ref 0.167–5.380)

## 2024-01-29 MED ORDER — OLMESARTAN MEDOXOMIL 20 MG PO TABS: 20.0000 mg | ORAL_TABLET | Freq: Every day | ORAL | 2 refills | Status: DC

## 2024-01-30 ENCOUNTER — Ambulatory Visit: Admitting: Family Medicine

## 2024-03-04 ENCOUNTER — Other Ambulatory Visit: Payer: Self-pay | Admitting: Medical Genetics

## 2024-03-06 ENCOUNTER — Ambulatory Visit (INDEPENDENT_AMBULATORY_CARE_PROVIDER_SITE_OTHER): Admitting: Family Medicine

## 2024-03-06 ENCOUNTER — Encounter: Payer: Self-pay | Admitting: Family Medicine

## 2024-03-06 VITALS — BP 135/89 | HR 74 | Ht 65.0 in | Wt 137.4 lb

## 2024-03-06 DIAGNOSIS — I10 Essential (primary) hypertension: Secondary | ICD-10-CM | POA: Diagnosis not present

## 2024-03-06 MED ORDER — AMLODIPINE-OLMESARTAN 5-20 MG PO TABS: 1.0000 | ORAL_TABLET | Freq: Every day | ORAL | 3 refills | Status: AC

## 2024-03-06 NOTE — Progress Notes (Signed)
   Established Patient Office Visit  Subjective   Patient ID: Alison Ballard, female    DOB: 04/23/2001  Age: 23 y.o. MRN: [id number]  Chief Complaint  Patient presents with   Medical Management of Chronic Issues    HPI Subjective - Blood pressure follow-up. Tolerating medications well without dizziness. Did not take medications last week due to flu, but has resumed. Reports home blood pressure readings are now in the 120s-130s, improved from the 160s.  Medications: Olmesartan  20mg  daily, amlodipine  5mg  daily, propranolol  at night.  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   ROS: Pertinent negatives: no dizziness.    The ASCVD Risk score (Arnett DK, et al., 2019) failed to calculate for the following reasons:   The 2019 ASCVD risk score is only valid for ages 64 to 29  Health Maintenance Due  Topic Date Due   HPV VACCINES (1 - 3-dose series) Never done   HIV Screening  Never done   Meningococcal B Vaccine (1 of 2 - Standard) 05/21/2019   Hepatitis C Screening  Never done   COVID-19 Vaccine (2 - 2024-25 season) 03/11/2023   Cervical Cancer Screening (Pap smear)  Never done   INFLUENZA VACCINE  02/08/2024      Objective:     BP 135/89   Pulse 74   Ht 5' 5 (1.651 m)   Wt 137 lb 6.4 oz (62.3 kg)   LMP 03/02/2024   SpO2 100%   BMI 22.86 kg/m    Physical Exam Gen: alert, oriented Pulm: no resp distress Psych: pleasant affect   No results found for any visits on 03/06/24.      Assessment & Plan:   Severe hypertension Assessment & Plan: - Reports home BP readings have improved from 160s to 120s-130s systolic. Tolerating current regimen of amlodipine , olmesartan , and propranolol  without side effects. - Will switch from separate amlodipine  5mg  and olmesartan  20mg  pills to a combination pill, amlodipine -olmesartan  5-20mg . - Sent 47-month supply of amlodipine -olmesartan  5-20mg  to pharmacy. - Advised to message if the  combination pill is not covered or is too expensive, and we can revert to separate pills. - Can continue taking separate pills until they run out. - Continue propranolol  as before.   Other orders -     amLODIPine -Olmesartan ; Take 1 tablet by mouth daily.  Dispense: 90 tablet; Refill: 3     Return for physical.    Toribio MARLA Slain, MD

## 2024-03-06 NOTE — Assessment & Plan Note (Signed)
-   Reports home BP readings have improved from 160s to 120s-130s systolic. Tolerating current regimen of amlodipine , olmesartan , and propranolol  without side effects. - Will switch from separate amlodipine  5mg  and olmesartan  20mg  pills to a combination pill, amlodipine -olmesartan  5-20mg . - Sent 31-month supply of amlodipine -olmesartan  5-20mg  to pharmacy. - Advised to message if the combination pill is not covered or is too expensive, and we can revert to separate pills. - Can continue taking separate pills until they run out. - Continue propranolol  as before.

## 2024-03-06 NOTE — Patient Instructions (Signed)
 It was nice to see you today,  We addressed the following topics today: - I have changed your blood pressure medication to 1 pill that contains both of your current medications.  The doses did not change.    Have a great day,  Rolan Slain, MD

## 2024-04-10 ENCOUNTER — Telehealth: Payer: Self-pay | Admitting: Neurology

## 2024-04-10 NOTE — Telephone Encounter (Signed)
 LVM and sent mychart msg informing pt of need to reschedule 04/23/24 appt - MD departure

## 2024-04-23 ENCOUNTER — Telehealth: Payer: BC Managed Care – PPO | Admitting: Neurology

## 2024-04-23 ENCOUNTER — Other Ambulatory Visit: Payer: Self-pay | Admitting: Medical Genetics

## 2024-04-23 DIAGNOSIS — Z006 Encounter for examination for normal comparison and control in clinical research program: Secondary | ICD-10-CM

## 2024-04-24 DIAGNOSIS — Z6822 Body mass index (BMI) 22.0-22.9, adult: Secondary | ICD-10-CM | POA: Diagnosis not present

## 2024-04-24 DIAGNOSIS — Z01419 Encounter for gynecological examination (general) (routine) without abnormal findings: Secondary | ICD-10-CM | POA: Diagnosis not present

## 2024-04-24 DIAGNOSIS — Z113 Encounter for screening for infections with a predominantly sexual mode of transmission: Secondary | ICD-10-CM | POA: Diagnosis not present

## 2024-04-28 ENCOUNTER — Encounter: Payer: Self-pay | Admitting: Family Medicine

## 2024-05-06 ENCOUNTER — Ambulatory Visit (INDEPENDENT_AMBULATORY_CARE_PROVIDER_SITE_OTHER): Admitting: Family Medicine

## 2024-05-06 ENCOUNTER — Encounter: Payer: Self-pay | Admitting: Family Medicine

## 2024-05-06 VITALS — BP 123/81 | HR 81 | Ht 65.0 in | Wt 136.1 lb

## 2024-05-06 DIAGNOSIS — I10 Essential (primary) hypertension: Secondary | ICD-10-CM | POA: Diagnosis not present

## 2024-05-06 DIAGNOSIS — Q613 Polycystic kidney, unspecified: Secondary | ICD-10-CM

## 2024-05-06 DIAGNOSIS — G43009 Migraine without aura, not intractable, without status migrainosus: Secondary | ICD-10-CM | POA: Diagnosis not present

## 2024-05-06 DIAGNOSIS — Z0000 Encounter for general adult medical examination without abnormal findings: Secondary | ICD-10-CM

## 2024-05-06 NOTE — Assessment & Plan Note (Signed)
-   Stable on current regimen of amlodipine -olmesartan  and propranolol . - Continue current medications. - Follow up in 6 months to recheck blood pressure.

## 2024-05-06 NOTE — Patient Instructions (Signed)
 It was nice to see you today,  We addressed the following topics today: - I am providing you a paper order for a Basic Metabolic Panel (BMP). You can get this drawn at your workplace. - Please follow up with a nephrologist. When you call [facility] you can ask to be scheduled with a different provider within the practice. - We will plan to follow up here in 6 months to check your blood pressure and ensure you have seen the nephrologist.  Have a great day,  Rolan Slain, MD

## 2024-05-06 NOTE — Progress Notes (Signed)
   Annual physical  Subjective    Patient ID: Alison Ballard, female    DOB: 28-Jun-2001  Age: 23 y.o. MRN: [id number]  Chief Complaint  Patient presents with   Annual Exam   HPI Alison Ballard is a 23 y.o. old female here  for annual exam.   Subjective - Follow-up for annual physical. No new concerns or issues reported. - Gynecology visit on 04/24/2024, no changes noted. - Reports headaches are well-controlled.  Medications Current medications include amlodipine -olmesartan , propranolol , Ubrelvy , Qulipta , and an oral contraceptive. Takes Pepcid  as needed for heartburn, approximately once a week.  PMH, PSH, FH, Social Hx PMHx: Hypertension, migraines, history of low bicarbonate on labs. Had influenza in August of this year, was treated with Tamiflu. PSH: Cholecystectomy last year, no ongoing issues. FH: Not discussed. Social Hx: Single, no children. Works at a cancer center. Denies tobacco or alcohol use. Exercises regularly by walking 30-45 minutes, 2-3 times per week.  ROS Constitutional: Denies new concerns. HEENT: Headaches are well-controlled. GI: Takes Pepcid  PRN for heartburn. Denies ongoing issues post-cholecystectomy. GU: Reports regular menstrual cycles with medication.    The ASCVD Risk score (Arnett DK, et al., 2019) failed to calculate for the following reasons:   The 2019 ASCVD risk score is only valid for ages 36 to 48  Health Maintenance Due  Topic Date Due   HPV VACCINES (1 - 3-dose series) Never done   HIV Screening  Never done   Meningococcal B Vaccine (1 of 2 - Standard) 05/21/2019   Hepatitis C Screening  Never done   Cervical Cancer Screening (Pap smear)  Never done   COVID-19 Vaccine (2 - 2025-26 season) 03/10/2024      Objective:     BP 123/81   Pulse 81   Ht 5' 5 (1.651 m)   Wt 136 lb 1.9 oz (61.7 kg)   LMP 04/28/2024   SpO2 100%   BMI 22.65 kg/m    Physical Exam Gen: alert, oriented HEENT: perrla, eomi, mmm CV: rrr, no murmur Pulm:  lctab. No wheeze or crackles.  GI: soft, nbs.  Nontender to palpation MSK: strength equal b/l. Normal gait Ext: no pedal edema Skin: warm and dry, no rashes Psych: pleasant affect.  Spontaneous speech   No results found for any visits on 05/06/24.      Assessment & Plan:   Physical exam, annual  Polycystic kidney disease Assessment & Plan: Encouraged pt to schedule a follow up with [facility].  Has not seen them in a few years.    Orders: -     Basic metabolic panel with GFR  Severe hypertension Assessment & Plan: - Stable on current regimen of amlodipine -olmesartan  and propranolol . - Continue current medications. - Follow up in 6 months to recheck blood pressure.  Orders: -     Basic metabolic panel with GFR  Migraine without aura and without status migrainosus, not intractable Assessment & Plan: - Headaches are well-controlled on Ubrelvy  and Qulipta . - Neurologist, Dr. Ines, no longer practicing at her neurology office. Will be scheduled with a new provider at the same clinic. - Continue current medications.      Return in about 6 months (around 11/04/2024) for htn, pkd.    Toribio MARLA Slain, MD

## 2024-05-06 NOTE — Assessment & Plan Note (Addendum)
-   Headaches are well-controlled on Ubrelvy  and Qulipta . - Neurologist, Dr. Ines, no longer practicing at her neurology office. Will be scheduled with a new provider at the same clinic. - Continue current medications.

## 2024-05-06 NOTE — Assessment & Plan Note (Signed)
 Encouraged pt to schedule a follow up with [facility].  Has not seen them in a few years.

## 2024-05-07 ENCOUNTER — Ambulatory Visit: Payer: Self-pay | Admitting: Family Medicine

## 2024-05-07 ENCOUNTER — Encounter: Payer: Self-pay | Admitting: Family Medicine

## 2024-05-07 ENCOUNTER — Other Ambulatory Visit: Payer: Self-pay | Admitting: Family Medicine

## 2024-05-07 DIAGNOSIS — R220 Localized swelling, mass and lump, head: Secondary | ICD-10-CM

## 2024-05-07 LAB — BASIC METABOLIC PANEL WITH GFR
BUN/Creatinine Ratio: 10 (ref 9–23)
BUN: 7 mg/dL (ref 6–20)
CO2: 21 mmol/L (ref 20–29)
Calcium: 9.8 mg/dL (ref 8.7–10.2)
Chloride: 101 mmol/L (ref 96–106)
Creatinine, Ser: 0.73 mg/dL (ref 0.57–1.00)
Glucose: 119 mg/dL — ABNORMAL HIGH (ref 70–99)
Potassium: 3.8 mmol/L (ref 3.5–5.2)
Sodium: 137 mmol/L (ref 134–144)
eGFR: 119 mL/min/1.73 (ref >59)

## 2024-05-08 DIAGNOSIS — I10 Essential (primary) hypertension: Secondary | ICD-10-CM | POA: Diagnosis not present

## 2024-05-08 DIAGNOSIS — N281 Cyst of kidney, acquired: Secondary | ICD-10-CM | POA: Diagnosis not present

## 2024-05-08 DIAGNOSIS — Q613 Polycystic kidney, unspecified: Secondary | ICD-10-CM | POA: Diagnosis not present

## 2024-05-08 DIAGNOSIS — Q446 Cystic disease of liver: Secondary | ICD-10-CM | POA: Diagnosis not present

## 2024-05-12 ENCOUNTER — Other Ambulatory Visit: Payer: Self-pay | Admitting: Family Medicine

## 2024-05-12 NOTE — Telephone Encounter (Signed)
 Has been faxed 05/12/2024

## 2024-05-13 LAB — CORTISOL: Cortisol: 15 ug/dL (ref 6.2–19.4)

## 2024-05-15 ENCOUNTER — Other Ambulatory Visit: Payer: Self-pay | Admitting: Nephrology

## 2024-05-15 DIAGNOSIS — N281 Cyst of kidney, acquired: Secondary | ICD-10-CM

## 2024-05-16 ENCOUNTER — Ambulatory Visit: Payer: Self-pay

## 2024-05-22 ENCOUNTER — Encounter: Payer: Self-pay | Admitting: Family Medicine

## 2024-05-22 ENCOUNTER — Other Ambulatory Visit: Payer: Self-pay | Admitting: Family Medicine

## 2024-05-22 MED ORDER — LORAZEPAM 1 MG PO TABS: 1.0000 mg | ORAL_TABLET | Freq: Once | ORAL | 0 refills | Status: AC

## 2024-06-30 ENCOUNTER — Emergency Department (HOSPITAL_COMMUNITY)

## 2024-06-30 ENCOUNTER — Encounter (HOSPITAL_COMMUNITY): Payer: Self-pay

## 2024-06-30 ENCOUNTER — Other Ambulatory Visit: Payer: Self-pay

## 2024-06-30 ENCOUNTER — Emergency Department (HOSPITAL_COMMUNITY)
Admission: EM | Admit: 2024-06-30 | Discharge: 2024-07-01 | Disposition: A | Discharge status: Home / Self Care | Attending: Emergency Medicine | Admitting: Emergency Medicine

## 2024-06-30 DIAGNOSIS — R10A3 Flank pain, bilateral: Secondary | ICD-10-CM

## 2024-06-30 DIAGNOSIS — R079 Chest pain, unspecified: Secondary | ICD-10-CM | POA: Diagnosis not present

## 2024-06-30 DIAGNOSIS — K7689 Other specified diseases of liver: Secondary | ICD-10-CM | POA: Diagnosis not present

## 2024-06-30 DIAGNOSIS — N281 Cyst of kidney, acquired: Secondary | ICD-10-CM | POA: Diagnosis not present

## 2024-06-30 DIAGNOSIS — Z79899 Other long term (current) drug therapy: Secondary | ICD-10-CM | POA: Diagnosis not present

## 2024-06-30 DIAGNOSIS — R1030 Lower abdominal pain, unspecified: Secondary | ICD-10-CM | POA: Insufficient documentation

## 2024-06-30 DIAGNOSIS — R0789 Other chest pain: Secondary | ICD-10-CM | POA: Insufficient documentation

## 2024-06-30 DIAGNOSIS — Q613 Polycystic kidney, unspecified: Secondary | ICD-10-CM

## 2024-06-30 DIAGNOSIS — R109 Unspecified abdominal pain: Secondary | ICD-10-CM | POA: Diagnosis not present

## 2024-06-30 DIAGNOSIS — N200 Calculus of kidney: Secondary | ICD-10-CM | POA: Diagnosis not present

## 2024-06-30 LAB — BASIC METABOLIC PANEL WITH GFR
Anion gap: 12 (ref 5–15)
BUN: 10 mg/dL (ref 6–20)
CO2: 23 mmol/L (ref 22–32)
Calcium: 10 mg/dL (ref 8.9–10.3)
Chloride: 102 mmol/L (ref 98–111)
Creatinine, Ser: 0.62 mg/dL (ref 0.44–1.00)
GFR, Estimated: >60 mL/min
Glucose, Bld: 100 mg/dL — ABNORMAL HIGH (ref 70–99)
Potassium: 3.8 mmol/L (ref 3.5–5.1)
Sodium: 137 mmol/L (ref 135–145)

## 2024-06-30 LAB — CBC
HCT: 37.9 % (ref 36.0–46.0)
Hemoglobin: 13.3 g/dL (ref 12.0–15.0)
MCH: 30.8 pg (ref 26.0–34.0)
MCHC: 35.1 g/dL (ref 30.0–36.0)
MCV: 87.7 fL (ref 80.0–100.0)
Platelets: 327 K/uL (ref 150–400)
RBC: 4.32 MIL/uL (ref 3.87–5.11)
RDW: 12.2 % (ref 11.5–15.5)
WBC: 7.1 K/uL (ref 4.0–10.5)
nRBC: 0 % (ref 0.0–0.2)

## 2024-06-30 LAB — URINALYSIS, ROUTINE W REFLEX MICROSCOPIC
Bilirubin Urine: NEGATIVE
Glucose, UA: NEGATIVE mg/dL
Ketones, ur: NEGATIVE mg/dL
Leukocytes,Ua: NEGATIVE
Nitrite: NEGATIVE
Protein, ur: NEGATIVE mg/dL
Specific Gravity, Urine: 1.005 (ref 1.005–1.030)
pH: 7 (ref 5.0–8.0)

## 2024-06-30 LAB — HCG, SERUM, QUALITATIVE: Preg, Serum: NEGATIVE

## 2024-06-30 MED ADMIN — Morphine Sulfate IV Soln PF 4 MG/ML: 4 mg | INTRAVENOUS | NDC 00641612501

## 2024-06-30 MED ADMIN — Ondansetron HCl Inj 4 MG/2ML (2 MG/ML): 4 mg | INTRAVENOUS | NDC 00409475518

## 2024-06-30 MED FILL — Morphine Sulfate IV Soln PF 4 MG/ML: 4.0000 mg | INTRAVENOUS | Qty: 1 | Status: AC

## 2024-06-30 MED FILL — Ondansetron HCl Inj 4 MG/2ML (2 MG/ML): 4.0000 mg | INTRAMUSCULAR | Qty: 2 | Status: AC

## 2024-06-30 NOTE — ED Provider Notes (Signed)
 " [place] EMERGENCY DEPARTMENT AT [facility] Provider Note   CSN: [id number] Arrival date & time: 06/30/24  1653     Patient presents with: No chief complaint on file.   Alison Ballard is a 23 y.o. female.   Patient is here for evaluation of bilateral flank pain that began this morning.  Patient has a history of polycystic kidney disease.  She denies any recent fevers, N/V/D, shortness of breath or chest pain.  She does note when the flank pain is most severe it knocks the air out of me.  Flank pain is worse with standing and bending over.  She denies dysuria or frank hematuria.  She does report some low mid abdominal pain comes and goes.  When she is seated and has not moved the pain improves though it does not fully resolve.  Denies recent heavy lifting or injury.  She has felt similar pain in the past and was told that she had kidney stones.  While CT imaging was pending, the patient started to complain of chest pressure and tightness. Additional work-up ordered. Patient states the chest tightness has improved with time. She says it feels similarly to pain she has experienced before associated with a known hernia. This pain comes and goes for her.  The history is provided by the patient.       Prior to Admission medications  Medication Sig Start Date End Date Taking? Authorizing Provider  amLODipine -olmesartan  (AZOR ) 5-20 MG tablet Take 1 tablet by mouth daily. 03/06/24   Chandra Toribio POUR, MD  Atogepant  (QULIPTA ) 60 MG TABS Take 1 tablet (60 mg total) by mouth daily. Please use copay card: BIN [id number] PCN [facility] GRP [id number] ID [id number]. Has been getting qulipta  from this pharmacy for the last year very effective.  Tried: propranolol , rizatriptan , sumatriptan, nortriptyline, topiramate 04/24/23   Ines Onetha NOVAK, MD  famotidine  (PEPCID ) 40 MG tablet TAKE 1 TABLET BY MOUTH EVERY DAY 11/05/23   McMichael, Bayley M, PA-C  [name] tablet Take 1 tablet  by mouth daily. 06/22/21   [provider]  LORazepam  (ATIVAN ) 1 MG tablet Take 1 tablet (1 mg total) by mouth once for 1 dose. 2 hours prior to MRI 07/11/24 07/11/24  Chandra Toribio POUR, MD  propranolol  ER (INDERAL  LA) 60 MG 24 hr capsule Take 1 capsule (60 mg total) by mouth at bedtime. 04/19/22   Ines Onetha NOVAK, MD  Ubrogepant  (UBRELVY ) 100 MG TABS Take 1 tablet (100 mg total) by mouth every 2 (two) hours as needed. Maximum 200mg  a day. 04/24/23   Ines Onetha NOVAK, MD    Allergies: Patient has no known allergies.    Review of Systems  Genitourinary:  Positive for flank pain. Negative for dysuria.    Updated Vital Signs BP 129/83   Pulse 92   Temp 98.4 F (36.9 C) (Oral)   Resp 16   Ht 5' 5 (1.651 m)   Wt 61.2 kg   LMP 06/16/2024 (Approximate)   SpO2 99%   BMI 22.47 kg/m   Physical Exam Vitals and nursing note reviewed.  Constitutional:      General: She is not in acute distress.    Appearance: Normal appearance. She is normal weight. She is not ill-appearing or toxic-appearing.  HENT:     Head: Normocephalic and atraumatic.     Nose: Nose normal.     Mouth/Throat:     Mouth: Mucous membranes are moist.  Eyes:  General: No scleral icterus.    Extraocular Movements: Extraocular movements intact.     Conjunctiva/sclera: Conjunctivae normal.  Cardiovascular:     Rate and Rhythm: Normal rate and regular rhythm.     Pulses: Normal pulses.     Heart sounds: Normal heart sounds.  Pulmonary:     Effort: Pulmonary effort is normal. No respiratory distress.     Breath sounds: Normal breath sounds. No stridor. No wheezing, rhonchi or rales.  Chest:     Chest wall: No tenderness.  Abdominal:     General: Abdomen is flat. Bowel sounds are normal. There is no distension.     Palpations: Abdomen is soft.     Tenderness: There is no abdominal tenderness. There is right CVA tenderness (Mild. Worse on R than L) and left CVA tenderness (Mild). There is no guarding.   Musculoskeletal:        General: Normal range of motion.     Cervical back: Normal range of motion.  Skin:    General: Skin is warm and dry.     Capillary Refill: Capillary refill takes less than 2 seconds.     Coloration: Skin is not jaundiced or pale.  Neurological:     Mental Status: She is alert and oriented to person, place, and time.     (all labs ordered are listed, but only abnormal results are displayed) Labs Reviewed  URINALYSIS, ROUTINE W REFLEX MICROSCOPIC - Abnormal; Notable for the following components:      Result Value   Color, Urine STRAW (*)    Hgb urine dipstick MODERATE (*)    Bacteria, UA RARE (*)    All other components within normal limits  BASIC METABOLIC PANEL WITH GFR - Abnormal; Notable for the following components:   Glucose, Bld 100 (*)    All other components within normal limits  HCG, SERUM, QUALITATIVE  CBC  TROPONIN T, HIGH SENSITIVITY    EKG: None  Radiology: DG Chest 2 View Result Date: 06/30/2024 EXAM: 2 VIEW(S) XRAY OF THE CHEST 06/30/2024 11:38:33 PM COMPARISON: 10/11/2023 CLINICAL HISTORY: chest pain FINDINGS: LUNGS AND PLEURA: No focal pulmonary opacity. No pleural effusion. No pneumothorax. HEART AND MEDIASTINUM: No acute abnormality of the cardiac and mediastinal silhouettes. BONES AND SOFT TISSUES: No acute osseous abnormality. IMPRESSION: 1. No acute cardiopulmonary abnormality. Electronically signed by: Norman Gatlin MD 06/30/2024 11:45 PM EST RP Workstation: [id number]   CT Renal Stone Study Result Date: 06/30/2024 EXAM: CT UROGRAM 06/30/2024 10:21:25 PM TECHNIQUE: CT of the abdomen and pelvis was performed without the administration of intravenous contrast as per CT urogram protocol. Multiplanar reformatted images as well as MIP urogram images are provided for review. Automated exposure control, iterative reconstruction, and/or weight based adjustment of the mA/kV was utilized to reduce the radiation dose to as low as reasonably  achievable. COMPARISON: None available. CLINICAL HISTORY: Abdominal/flank pain, stone suspected. FINDINGS: LOWER CHEST: No acute abnormality. LIVER: Innumerable hepatic cysts. GALLBLADDER AND BILE DUCTS: Cholecystectomy. No biliary ductal dilatation. SPLEEN: No acute abnormality. PANCREAS: No acute abnormality. ADRENAL GLANDS: No acute abnormality. KIDNEYS, URETERS AND BLADDER: Nonobstructing bilateral nephrolithiasis. No obstructing ureteral calculi or hydronephrosis. No perinephric or periureteral stranding. Multiple renal cysts, some of which are hyperdense. These are not well evaluated without IV contrast. They are grossly similar to 10/07/2023. Differential diagnosis includes proteinaceous/hemorrhagic cysts; however, underlying neoplasm is not excluded. Urinary bladder is unremarkable. GI AND BOWEL: Stomach demonstrates no acute abnormality. There is no bowel obstruction. Normal appendix. PERITONEUM AND RETROPERITONEUM: No  ascites. No free air. VASCULATURE: Aorta is normal in caliber. LYMPH NODES: No lymphadenopathy. REPRODUCTIVE ORGANS: No acute abnormality. BONES AND SOFT TISSUES: No acute osseous abnormality. No focal soft tissue abnormality. IMPRESSION: 1. Nonobstructing bilateral nephrolithiasis. No obstructing ureteral calculi or hydronephrosis. 2. Multiple renal cysts, some hyperdense, not well evaluated without IV contrast, grossly similar to 10/07/23. Differential diagnosis includes proteinaceous/hemorrhagic cysts; underlying neoplasm is not excluded. Further evaluation with non-emergent MRI abdomen as per renal mass protocol is recommended. Electronically signed by: Norman Gatlin MD 06/30/2024 10:40 PM EST RP Workstation: [id number]     Procedures   Medications Ordered in the ED  morphine  (PF) 4 MG/ML injection 4 mg (4 mg Intravenous Given 06/30/24 2229)  ondansetron  (ZOFRAN ) injection 4 mg (4 mg Intravenous Given 06/30/24 2229)    Clinical Course as of 07/01/24 0026  Mon Jun 30, 2024   2329 Patient complained to nursing staff of new onset chest pressure. EKG, troponin, and Chest x-ray ordered. [EA]    Clinical Course User Index [EA] Rosina Almarie LABOR, PA-C     Patient presents to the ED for concern of bilateral flank pain, this involves an extensive number of treatment options, and is a complaint that carries with it a high risk of complications and morbidity.  The differential diagnosis includes nephrolithiasis, pyelonephritis, musculoskeletal, obstructive uropathy.  UA not indicative of pyelonephritis.  CT renal study shows no obstructive uropathy or obstructive nephrolithiasis.   Co morbidities that complicate the patient evaluation  Polycystic kidney disease   Additional history obtained:  Additional history obtained from  Outside Medical Records and Past Admission   External records from outside source obtained and reviewed including prior PCP visits and previous ED visits.   Lab Tests:  I Ordered, and personally interpreted labs.  The pertinent results include: UA shows moderate hemoglobin.   Imaging Studies ordered:  I ordered imaging studies including chest x-ray and CT renal CT renal shows nonobstructing calculus in the right kidney.  Innumerable hypoattenuating lesions throughout bilateral kidneys favored to represent cysts with recommendation for nonemergent MRI abdomen for further evaluation. Chest x-ray shows no acute abnormalities   Cardiac Monitoring:  The patient was maintained on a cardiac monitor.  I personally viewed and interpreted the cardiac monitored which showed an underlying rhythm of: Sinus rhythm with no acute evidence of STEMI.   Medicines ordered and prescription drug management:  I ordered medication including morphine  and Zofran  for pain and nausea Reevaluation of the patient after these medicines showed that the patient improved   Problem List / ED Course:  Clinical Course as of 07/01/24 0026  Mon Jun 30, 2024   2329 Patient complained to nursing staff of new onset chest pressure. EKG, troponin, and Chest x-ray ordered. [EA]    Clinical Course User Index [EA] Rosina Almarie A, PA-C    Bilateral flank pain.  UA shows hemoglobin in the urine.  Blood work unremarkable.  CT renal study shows nonobstructing calculus in the right kidney and no other nephroureterolithiasis or obstructive uropathy.  Recommendation for nonemergent MRI abdomen for further evaluation of innumerable hypoattenuating lesions throughout bilateral kidneys favored to represent cysts --patient is already scheduled for an MRI abdomen on January 5. Chest pressure, tightness.  Chest x-ray shows no acute abnormalities.  EKG shows sinus rhythm with no evidence of STEMI.  Troponin pending at provider signout.   Reevaluation:  After the interventions noted above, I reevaluated the patient and found that they have :improved   Dispostion:  Troponin pending at  provider sign out. Care transferred to Leita Chancy PA-C.   Medical Decision Making Amount and/or Complexity of Data Reviewed Labs: ordered. Radiology: ordered.  Risk Prescription drug management.   This note was produced using Electronics Engineer. While the provider has reviewed and verified all clinical information, transcription errors may remain.    Final diagnoses:  None    ED Discharge Orders     None          Rosina Almarie DELENA DEVONNA 07/01/24 0026    Rogelia Jerilynn RAMAN, MD 07/01/24 2358  "

## 2024-06-30 NOTE — ED Triage Notes (Signed)
 Pt presents to the ED due to severe bilateral flank pain and lower back pain that started this morning. Pt has been nauseous but denies any vomiting. Pain worsens with movement.

## 2024-06-30 NOTE — ED Provider Notes (Incomplete)
 " [place] EMERGENCY DEPARTMENT AT [facility] Provider Note   CSN: [id number] Arrival date & time: 06/30/24  1653     Patient presents with: No chief complaint on file.   Alison Ballard is a 23 y.o. female.   Patient is here for evaluation of bilateral flank pain that began this morning.  Patient has a history of polycystic kidney disease.  She denies any recent fevers, N/V/D, shortness of breath or chest pain.  She does note when the flank pain is most severe it knocks the air out of me.  Flank pain is worse with standing and bending over.  She denies dysuria or frank hematuria.  She does report some low mid abdominal pain comes and goes.  When she is seated and has not moved the pain improves though it does not  The history is provided by the patient.       Prior to Admission medications  Medication Sig Start Date End Date Taking? Authorizing Provider  amLODipine -olmesartan  (AZOR ) 5-20 MG tablet Take 1 tablet by mouth daily. 03/06/24   Chandra Toribio POUR, MD  Atogepant  (QULIPTA ) 60 MG TABS Take 1 tablet (60 mg total) by mouth daily. Please use copay card: BIN [id number] PCN [facility] GRP [id number] ID [id number]. Has been getting qulipta  from this pharmacy for the last year very effective.  Tried: propranolol , rizatriptan , sumatriptan, nortriptyline, topiramate 04/24/23   Ines Onetha NOVAK, MD  famotidine  (PEPCID ) 40 MG tablet TAKE 1 TABLET BY MOUTH EVERY DAY 11/05/23   McMichael, Bayley M, PA-C  [name] tablet Take 1 tablet by mouth daily. 06/22/21   [provider]  LORazepam  (ATIVAN ) 1 MG tablet Take 1 tablet (1 mg total) by mouth once for 1 dose. 2 hours prior to MRI 07/11/24 07/11/24  Chandra Toribio POUR, MD  propranolol  ER (INDERAL  LA) 60 MG 24 hr capsule Take 1 capsule (60 mg total) by mouth at bedtime. 04/19/22   Ines Onetha NOVAK, MD  Ubrogepant  (UBRELVY ) 100 MG TABS Take 1 tablet (100 mg total) by mouth every 2 (two) hours as needed. Maximum 200mg  a day.  04/24/23   Ines Onetha NOVAK, MD    Allergies: Patient has no known allergies.    Review of Systems  Updated Vital Signs BP (!) 160/96 (BP Location: Right Arm)   Pulse 87   Temp 99.1 F (37.3 C) (Oral)   Resp 16   Ht 5' 5 (1.651 m)   Wt 61.2 kg   LMP 06/20/2024 (Approximate)   SpO2 100%   BMI 22.47 kg/m   Physical Exam  (all labs ordered are listed, but only abnormal results are displayed) Labs Reviewed  URINALYSIS, ROUTINE W REFLEX MICROSCOPIC - Abnormal; Notable for the following components:      Result Value   Color, Urine STRAW (*)    Hgb urine dipstick MODERATE (*)    Bacteria, UA RARE (*)    All other components within normal limits  BASIC METABOLIC PANEL WITH GFR - Abnormal; Notable for the following components:   Glucose, Bld 100 (*)    All other components within normal limits  HCG, SERUM, QUALITATIVE  CBC    EKG: None  Radiology: No results found.  {Document cardiac monitor, telemetry assessment procedure when appropriate:32947} Procedures   Medications Ordered in the ED  morphine  (PF) 4 MG/ML injection 4 mg (has no administration in time range)  ondansetron  (ZOFRAN ) injection 4 mg (has no administration in time range)      {  Click here for ABCD2, HEART and other calculators REFRESH Note before signing:1}                              Medical Decision Making Amount and/or Complexity of Data Reviewed Labs: ordered. Radiology: ordered.  Risk Prescription drug management.   ***  {Document critical care time when appropriate  Document review of labs and clinical decision tools ie CHADS2VASC2, etc  Document your independent review of radiology images and any outside records  Document your discussion with family members, caretakers and with consultants  Document social determinants of health affecting pt's care  Document your decision making why or why not admission, treatments were needed:32947:::1}   Final diagnoses:  None    ED  Discharge Orders     None        "

## 2024-07-01 LAB — TROPONIN T, HIGH SENSITIVITY: Troponin T High Sensitivity: <15 ng/L (ref 0–19)

## 2024-07-01 MED ORDER — KETOROLAC TROMETHAMINE 15 MG/ML IJ SOLN
15.0000 mg | Freq: Once | INTRAMUSCULAR | Status: AC
  Administered 2024-07-01: 15 mg via INTRAVENOUS
  Filled 2024-07-01: qty 1

## 2024-07-01 MED ORDER — LORAZEPAM 0.5 MG PO TABS
0.5000 mg | ORAL_TABLET | Freq: Once | ORAL | Status: AC
  Administered 2024-07-01: 0.5 mg via ORAL
  Filled 2024-07-01: qty 1

## 2024-07-01 NOTE — Discharge Instructions (Signed)
 Follow-up with your care team. Return to the emergency room for any worsening or concerning symptoms.

## 2024-07-01 NOTE — ED Provider Notes (Signed)
 23 yo female with bilateral flank pain onset today, hx PCOS.  Developed CP while in the ER. Pending troponin. CT stone study- aware of report, scheduled for OP MRI.  Physical Exam  BP 129/83   Pulse 92   Temp 98.4 F (36.9 C) (Oral)   Resp 16   Ht 5' 5 (1.651 m)   Wt 61.2 kg   LMP 06/16/2024 (Approximate)   SpO2 99%   BMI 22.47 kg/m   Physical Exam  Procedures  Procedures  ED Course / MDM   Clinical Course as of 07/01/24 0139  Mon Jun 30, 2024  2329 Patient complained to nursing staff of new onset chest pressure. EKG, troponin, and Chest x-ray ordered. [EA]    Clinical Course User Index [EA] Rosina Almarie LABOR, PA-C   Medical Decision Making Amount and/or Complexity of Data Reviewed Labs: ordered. Radiology: ordered.  Risk Prescription drug management.   Troponin is negative.  On recheck, patient is tearful, states that her chest pain was brief and had resolved but she continues to have pain in her lower back and bilateral flank areas.  She states that she has frequent episodes of flank pain secondary to her polycystic kidney disease.  She normally finds that pain medications are very helpful and she just tries to sleep throughout the pain which improves the following day. She is provided with dose of Toradol  after discussion, she is allowed to take NSAIDs, she just finds that they do not really help with her pain.  Also provided with Ativan  to help with her pain.  On recheck, pain has improved and patient is ready for discharge.  She plans to follow-up with her care team.       Beverley Leita LABOR, PA-C 07/01/24 0139    Trine Raynell Moder, MD 07/01/24 [phone]

## 2024-07-14 ENCOUNTER — Other Ambulatory Visit

## 2024-07-14 DIAGNOSIS — N281 Cyst of kidney, acquired: Secondary | ICD-10-CM

## 2024-07-14 MED ORDER — GADOPICLENOL 0.5 MMOL/ML IV SOLN
6.0000 mL | Freq: Once | INTRAVENOUS | Status: AC | PRN
  Administered 2024-07-14: 6 mL via INTRAVENOUS

## 2024-11-04 ENCOUNTER — Ambulatory Visit: Admitting: Family Medicine
